# Patient Record
Sex: Female | Born: 1955 | Race: White | Hispanic: No | Marital: Married | Smoking: Former smoker
Health system: Southern US, Community
[De-identification: ages and names within clinical notes are randomized; demographics above are authoritative.]

## PROBLEM LIST (undated history)

## (undated) DIAGNOSIS — R7302 Impaired glucose tolerance (oral): Secondary | ICD-10-CM

## (undated) DIAGNOSIS — F32A Depression, unspecified: Secondary | ICD-10-CM

## (undated) DIAGNOSIS — K219 Gastro-esophageal reflux disease without esophagitis: Secondary | ICD-10-CM

## (undated) DIAGNOSIS — F329 Major depressive disorder, single episode, unspecified: Secondary | ICD-10-CM

## (undated) DIAGNOSIS — M1990 Unspecified osteoarthritis, unspecified site: Secondary | ICD-10-CM

## (undated) DIAGNOSIS — I639 Cerebral infarction, unspecified: Secondary | ICD-10-CM

## (undated) DIAGNOSIS — E785 Hyperlipidemia, unspecified: Secondary | ICD-10-CM

## (undated) DIAGNOSIS — G4730 Sleep apnea, unspecified: Secondary | ICD-10-CM

## (undated) DIAGNOSIS — I10 Essential (primary) hypertension: Secondary | ICD-10-CM

## (undated) DIAGNOSIS — R531 Weakness: Secondary | ICD-10-CM

## (undated) DIAGNOSIS — G5680 Other specified mononeuropathies of unspecified upper limb: Secondary | ICD-10-CM

## (undated) DIAGNOSIS — R519 Headache, unspecified: Secondary | ICD-10-CM

## (undated) DIAGNOSIS — K222 Esophageal obstruction: Secondary | ICD-10-CM

## (undated) DIAGNOSIS — R51 Headache: Secondary | ICD-10-CM

## (undated) HISTORY — DX: Weakness: R53.1

## (undated) HISTORY — DX: Cerebral infarction, unspecified: I63.9

## (undated) HISTORY — PX: ESOPHAGOGASTRODUODENOSCOPY: SHX1529

## (undated) HISTORY — DX: Hyperlipidemia, unspecified: E78.5

## (undated) HISTORY — DX: Essential (primary) hypertension: I10

## (undated) HISTORY — DX: Headache: R51

## (undated) HISTORY — DX: Esophageal obstruction: K22.2

## (undated) HISTORY — DX: Gastro-esophageal reflux disease without esophagitis: K21.9

## (undated) HISTORY — DX: Major depressive disorder, single episode, unspecified: F32.9

## (undated) HISTORY — DX: Other specified mononeuropathies of unspecified upper limb: G56.80

## (undated) HISTORY — DX: Impaired glucose tolerance (oral): R73.02

## (undated) HISTORY — DX: Depression, unspecified: F32.A

## (undated) HISTORY — DX: Headache, unspecified: R51.9

---

## 1977-01-10 HISTORY — PX: TUBAL LIGATION: SHX77

## 1977-01-10 HISTORY — PX: UMBILICAL HERNIA REPAIR: SHX196

## 1997-11-10 ENCOUNTER — Other Ambulatory Visit: Admission: RE | Admit: 1997-11-10 | Discharge: 1997-11-10 | Payer: Self-pay | Admitting: Family Medicine

## 1998-03-18 ENCOUNTER — Encounter: Payer: Self-pay | Admitting: Family Medicine

## 1998-03-18 ENCOUNTER — Ambulatory Visit (HOSPITAL_COMMUNITY): Admission: RE | Admit: 1998-03-18 | Discharge: 1998-03-18 | Payer: Self-pay | Admitting: Family Medicine

## 1998-08-17 ENCOUNTER — Emergency Department (HOSPITAL_COMMUNITY): Admission: EM | Admit: 1998-08-17 | Discharge: 1998-08-17 | Payer: Self-pay | Admitting: Emergency Medicine

## 1998-08-17 ENCOUNTER — Encounter: Payer: Self-pay | Admitting: Emergency Medicine

## 1998-08-18 ENCOUNTER — Ambulatory Visit (HOSPITAL_COMMUNITY): Admission: RE | Admit: 1998-08-18 | Discharge: 1998-08-18 | Payer: Self-pay | Admitting: General Surgery

## 1998-08-18 ENCOUNTER — Encounter: Payer: Self-pay | Admitting: General Surgery

## 1998-11-18 ENCOUNTER — Other Ambulatory Visit: Admission: RE | Admit: 1998-11-18 | Discharge: 1998-11-18 | Payer: Self-pay | Admitting: Family Medicine

## 1999-01-18 ENCOUNTER — Other Ambulatory Visit: Admission: RE | Admit: 1999-01-18 | Discharge: 1999-01-18 | Payer: Self-pay | Admitting: Obstetrics and Gynecology

## 1999-03-19 ENCOUNTER — Encounter: Payer: Self-pay | Admitting: Family Medicine

## 1999-03-19 ENCOUNTER — Encounter: Admission: RE | Admit: 1999-03-19 | Discharge: 1999-03-19 | Payer: Self-pay | Admitting: Family Medicine

## 1999-12-10 ENCOUNTER — Other Ambulatory Visit: Admission: RE | Admit: 1999-12-10 | Discharge: 1999-12-10 | Payer: Self-pay | Admitting: Obstetrics and Gynecology

## 2000-03-06 ENCOUNTER — Other Ambulatory Visit: Admission: RE | Admit: 2000-03-06 | Discharge: 2000-03-06 | Payer: Self-pay | Admitting: Obstetrics and Gynecology

## 2000-03-29 ENCOUNTER — Other Ambulatory Visit: Admission: RE | Admit: 2000-03-29 | Discharge: 2000-03-29 | Payer: Self-pay | Admitting: Obstetrics and Gynecology

## 2000-11-20 ENCOUNTER — Encounter: Payer: Self-pay | Admitting: Gastroenterology

## 2000-11-20 ENCOUNTER — Ambulatory Visit (HOSPITAL_COMMUNITY): Admission: RE | Admit: 2000-11-20 | Discharge: 2000-11-20 | Payer: Self-pay | Admitting: Gastroenterology

## 2001-12-19 ENCOUNTER — Encounter: Payer: Self-pay | Admitting: Emergency Medicine

## 2001-12-19 ENCOUNTER — Inpatient Hospital Stay (HOSPITAL_COMMUNITY): Admission: EM | Admit: 2001-12-19 | Discharge: 2001-12-24 | Payer: Self-pay | Admitting: Emergency Medicine

## 2001-12-21 ENCOUNTER — Encounter: Payer: Self-pay | Admitting: Cardiology

## 2003-03-24 ENCOUNTER — Inpatient Hospital Stay (HOSPITAL_COMMUNITY): Admission: EM | Admit: 2003-03-24 | Discharge: 2003-03-26 | Payer: Self-pay | Admitting: Emergency Medicine

## 2004-07-20 ENCOUNTER — Emergency Department (HOSPITAL_COMMUNITY): Admission: EM | Admit: 2004-07-20 | Discharge: 2004-07-20 | Payer: Self-pay | Admitting: Emergency Medicine

## 2004-07-26 ENCOUNTER — Emergency Department (HOSPITAL_COMMUNITY): Admission: EM | Admit: 2004-07-26 | Discharge: 2004-07-26 | Payer: Self-pay | Admitting: Emergency Medicine

## 2009-01-10 DIAGNOSIS — K222 Esophageal obstruction: Secondary | ICD-10-CM

## 2009-01-10 HISTORY — DX: Esophageal obstruction: K22.2

## 2010-03-09 ENCOUNTER — Encounter (INDEPENDENT_AMBULATORY_CARE_PROVIDER_SITE_OTHER): Payer: Self-pay | Admitting: *Deleted

## 2010-03-18 NOTE — Letter (Signed)
Summary: New Patient letter  [facility]  [address] [place], [place] [address]   Phone: [phone]  Fax: [phone]       03/09/2010 MRN: [id number]  Casey Chen [address] [place], [place]  [address]  Dear Ms. Casey Chen,  Welcome to the Gastroenterology Division at [facility]    You are scheduled to see Dr.  Vladimir Creeks on April 19, 2010 at 1:30pm on the 3rd floor at [facility], [address]  We ask that you try to arrive at our office 15 minutes prior to your appointment time to allow for check-in.  We would like you to complete the enclosed self-administered evaluation form prior to your visit and bring it with you on the day of your appointment.  We will review it with you.  Also, please bring a complete list of all your medications or, if you prefer, bring the medication bottles and we will list them.  Please bring your insurance card so that we may make a copy of it.  If your insurance requires a referral to see a specialist, please bring your referral form from your primary care physician.  Co-payments are due at the time of your visit and may be paid by cash, check or credit card.     Your office visit will consist of a consult with your physician (includes a physical exam), any laboratory testing he/she may order, scheduling of any necessary diagnostic testing (e.g. x-ray, ultrasound, CT-scan), and scheduling of a procedure (e.g. Endoscopy, Colonoscopy) if required.  Please allow enough time on your schedule to allow for any/all of these possibilities.    If you cannot keep your appointment, please call [phone] to cancel or reschedule prior to your appointment date.  This allows [place] the opportunity to schedule an appointment for another patient in need of care.  If you do not cancel or reschedule by 5 p.m. the business day prior to your appointment date, you will be charged a $50.00 late cancellation/no-show fee.    Thank you for  choosing [facility] for your medical needs.  We appreciate the opportunity to care for you.  Please visit [place] at our website  to learn more about our practice.                     Sincerely,                                                             The Gastroenterology Division

## 2010-04-19 ENCOUNTER — Encounter: Payer: Self-pay | Admitting: Gastroenterology

## 2010-04-19 ENCOUNTER — Ambulatory Visit (INDEPENDENT_AMBULATORY_CARE_PROVIDER_SITE_OTHER): Payer: BLUE CROSS/BLUE SHIELD | Admitting: Gastroenterology

## 2010-04-19 DIAGNOSIS — R1310 Dysphagia, unspecified: Secondary | ICD-10-CM | POA: Insufficient documentation

## 2010-04-19 DIAGNOSIS — Z1211 Encounter for screening for malignant neoplasm of colon: Secondary | ICD-10-CM

## 2010-04-19 DIAGNOSIS — K219 Gastro-esophageal reflux disease without esophagitis: Secondary | ICD-10-CM | POA: Insufficient documentation

## 2010-04-19 MED ORDER — PEG-KCL-NACL-NASULF-NA ASC-C 100 G PO SOLR: 1.0000 | Freq: Once | ORAL | Status: AC

## 2010-04-19 NOTE — Patient Instructions (Addendum)
Esophageal Stricture (Narrowing) The esophagus is the long, narrow tube which carries food and liquid from the mouth to the stomach. Sometimes a part of the esophagus becomes narrow and makes it difficult, painful, or even impossible to swallow. This is called an esophageal stricture.  SYMPTOMS Some of the problems are difficulty swallowing or pain with swallowing. CAUSES Common causes of blockage or strictures of the esophagus are:  Exposure of the lower esophagus to the acid from the stomach may cause narrowing.   Hiatal hernia in which a small part of the stomach bulges up through the diaphragm can cause a narrowing in the bottom of the esophagus.   Scleroderma is a tissue disorder that affects the esophagus and makes swallowing difficult.   Achalasia is an absence of nerves in the lower esophagus and to the esophageal sphincter. This absence of nerves may be congenital (present since birth). This can cause irregular spasms which do not allow food and fluid through.   Strictures may develop from swallowing materials which damage the esophagus. Examples are acids or alkalis such as lye.   Schatzki's Ring is a narrow ring of non-cancerous tissue which narrows the lower esophagus. The cause of this is unknown.   Growths can block the esophagus.  DIAGNOSIS Your caregiver often suspects this problem by taking a medical history. They will also do a physical exam. They may then take X-rays and/or perform an endoscopy. Endoscopy is an exam in which a tube like a small flexible telescope is used to look at your esophagus.  TREATMENT AND PROCEDURE  One form of treatment is to dilate the narrow area. This means to stretch it.   When this is not successful, chest surgery may be required. This is a much more extensive form of treatment with a longer recovery time.  Both of the above treatments make the passage of food and water into the stomach easier. They also make it easier for stomach contents  to bubble back into the esophagus. Special medications may be used following the procedure to help prevent further narrowing. Medications may be used to lower the amount of acid in the stomach juice.  SEEK IMMEDIATE MEDICAL CARE IF:  Your swallowing is becoming more painful, difficult, or you are unable to swallow.   You vomit up blood.   You develop black tarry stools.   You develop chills or an unexplained fever of over 101 F (38.3 C).   You develop chest or abdominal pain.   You develop shortness of breath, feel lightheaded, or faint.  Follow up with medical care as your caregiver suggests. Document Released: 09/06/2005 Document Re-Released: 10/24/2006 [facility] Patient Information 2011 [place], [place]. Colonoscopy A colonoscopy is an exam to evaluate your entire colon. In this exam, your colon is cleansed. A long fiberoptic tube is inserted through your rectum and into your colon. The fiberoptic scope (endoscope) is a long bundle of enclosed and very flexible fibers. These fibers transmit light to the area examined and send images from that area to your caregiver. Discomfort is usually minimal. You may be given a drug to help you sleep (sedative) during or prior to the procedure. This exam helps to detect lumps (tumors), polyps, inflammation, and areas of bleeding. Your caregiver may also take a small piece of tissue (biopsy) that will be examined under a microscope. BEFORE THE PROCEDURE  A clear liquid diet may be required for 2 days before the exam.   Liquid injections (enemas) or laxatives may be required.  A large amount of electrolyte solution may be given to you to drink over a short period of time. This solution is used to clean out your colon.   You should be present 1 prior to your procedure or as directed by your caregiver.   Check in at the admissions desk to fill out necessary forms if not preregistered. There will be consent forms to sign prior to the procedure. If  accompanied by friends or family, there is a waiting area for them while you are having your procedure.  LET YOUR CAREGIVER KNOW ABOUT:  Allergies to food or medicine.  Medicines taken, including vitamins, herbs, eyedrops, over-the-counter medicines, and creams.   Use of steroids (by mouth or creams).   Previous problems with anesthetics or numbing medicines.   History of bleeding problems or blood clots.  Previous surgery.   Other health problems, including diabetes and kidney problems.   Possibility of pregnancy, if this applies.   AFTER THE PROCEDURE  If you received a sedative and/or pain medicine, you will need to arrange for someone to drive you home.   Occasionally, there is a little blood passed with the first bowel movement. DO NOT be concerned.  HOME CARE INSTRUCTIONS  It is not unusual to pass moderate amounts of gas and experience mild abdominal cramping following the procedure. This is due to air being used to inflate your colon during the exam. Walking or a warm pack on your belly (abdomen) may help.   You may resume all normal meals and activities after sedatives and medicines have worn off.   Only take over-the-counter or prescription medicines for pain, discomfort, or fever as directed by your caregiver. DO NOT use aspirin or blood thinners if a biopsy was taken. Consult your caregiver for medicine usage if biopsies were taken.  FINDING OUT THE RESULTS OF YOUR TEST Not all test results are available during your visit. If your test results are not back during the visit, make an appointment with your caregiver to find out the results. Do not assume everything is normal if you have not heard from your caregiver or the medical facility. It is important for you to follow up on all of your test results. SEEK IMMEDIATE MEDICAL CARE IF:  You pass large blood clots or fill a toilet with blood following the procedure. This may also occur 10 to 14 days following the procedure.  This is more likely if a biopsy was taken.   You develop abdominal pain that keeps getting worse and cannot be relieved with medicine.  Document Released: 12/25/1999 Document Re-Released: 03/23/2009 [facility] Patient Information 2011 [place], [place]. Acid Reflux (GERD) Acid reflux is also called gastroesophageal reflux disease (GERD). Your stomach makes acid to help digest food. Acid reflux happens when acid from your stomach goes into the tube between your mouth and stomach (esophagus). Your stomach is protected from the acid, but this tube is not. When acid gets into the tube, it may cause a burning feeling in the chest (heartburn). Besides heartburn, other health problems can happen if the acid keeps going into the tube. Some causes of acid reflux include:  Being overweight.   Smoking.   Drinking alcohol.   Eating large meals.   Eating meals and then going to bed right away.   Eating certain foods.   Increased stomach acid production.  HOME CARE  Take all medicine as told by your doctor.   You may need to:   Lose weight.   Avoid  alcohol.   Quit smoking.   Do not eat big meals. It is better to eat smaller meals throughout the day.   Do not eat a meal and then nap or go to bed.   Sleep with your head higher than your stomach.   Avoid foods that bother you.   You may need more tests, or you may need to see a special doctor.  GET HELP RIGHT AWAY IF:  You have chest pain that is different than before.   You have pain that goes to your arms, jaw, or between your shoulder blades.   You throw up (vomit) blood, dark brown liquid, or your throw up looks like coffee grounds.   You have trouble swallowing.   You have trouble breathing or cannot stop coughing.   You feel dizzy or pass out.   Your skin is cool, wet, and pale.   Your medicine is not helping.  MAKE SURE YOU:   Understand these instructions.   Will watch your condition.   Will get help right away if  you are not doing well or get worse.  Document Released: 06/15/2007 Document Re-Released: 03/23/2009 [facility] Patient Information 2011 [place], [place]. Your EGD is scheduled on 04/20/2010 at 3pm Your Colonoscopy is scheduled on 05/13/2010 at 1:30pm We will send in your MoviPrep to your pharmacy today

## 2010-04-19 NOTE — Progress Notes (Signed)
History of Present Illness:  Casey Chen is a pleasant, 55 year old white female with a history of GERD and an esophageal stricture referred at the request of Dr. Patsy Lager for evaluation of reflux and dysphagia. She's been on various PPIs. Most recently, she was placed on zegerid at bedtime. Her main complaint is burning and regurgitation after retiring. She sleeps propped up and does not eat for several hours prior to retiring. She also complains of dysphagia to solids. She's on no gastric irritants including nonsteroidals.    Review of Systems: Pertinent positive and negative review of systems were noted in the above HPI section. All other review of systems were otherwise negative.    Current Medications, Allergies, Past Medical History, Past Surgical History, Family History and Social History were reviewed in [employer] [facility] electronic medical record  Vital signs were reviewed in today's medical record. Physical Exam: General: Well developed , well nourished, no acute distress Head: Normocephalic and atraumatic Eyes:  sclerae anicteric, EOMI Ears: Normal auditory acuity Mouth: No deformity or lesions Lungs: Clear throughout to auscultation Heart: Regular rate and rhythm; no murmurs, rubs or bruits Abdomen: Soft, non tender and non distended. No masses, hepatosplenomegaly or hernias noted. Normal Bowel sounds Rectal:deferred Musculoskeletal: Symmetrical with no gross deformities  Pulses:  Normal pulses noted Extremities: No clubbing, cyanosis, edema or deformities noted Neurological: Alert oriented x 4, grossly nonfocal Psychological:  Alert and cooperative. Normal mood and affect

## 2010-04-19 NOTE — Assessment & Plan Note (Signed)
This is probably to due to a recurrent esophageal stricture  Recommendations #1 upper endoscopy with dilatation as indicated

## 2010-04-19 NOTE — Assessment & Plan Note (Addendum)
She has nocturnal GERD despite antireflux measures and Zegerid.  Recommendations #1 increase Zegerid twice a day #2 if not improved I would proceed with a gastric emptying scan

## 2010-04-19 NOTE — Assessment & Plan Note (Signed)
Screening colonoscopy recommended

## 2010-04-20 ENCOUNTER — Ambulatory Visit (AMBULATORY_SURGERY_CENTER): Payer: BC Managed Care – PPO | Admitting: Gastroenterology

## 2010-04-20 ENCOUNTER — Encounter: Payer: Self-pay | Admitting: Gastroenterology

## 2010-04-20 DIAGNOSIS — R1310 Dysphagia, unspecified: Secondary | ICD-10-CM

## 2010-04-20 DIAGNOSIS — K222 Esophageal obstruction: Secondary | ICD-10-CM

## 2010-04-20 DIAGNOSIS — K219 Gastro-esophageal reflux disease without esophagitis: Secondary | ICD-10-CM

## 2010-04-20 MED ORDER — OMEPRAZOLE-SODIUM BICARBONATE 40-1100 MG PO CAPS: 1.0000 | ORAL_CAPSULE | ORAL | Status: DC

## 2010-04-20 MED ORDER — SODIUM CHLORIDE 0.9 % IV SOLN: 500.0000 mL | INTRAVENOUS | Status: DC

## 2010-04-20 NOTE — Patient Instructions (Addendum)
Acid Reflux (GERD) Acid reflux is also called gastroesophageal reflux disease (GERD). Your stomach makes acid to help digest food. Acid reflux happens when acid from your stomach goes into the tube between your mouth and stomach (esophagus). Your stomach is protected from the acid, but this tube is not. When acid gets into the tube, it may cause a burning feeling in the chest (heartburn). Besides heartburn, other health problems can happen if the acid keeps going into the tube. Some causes of acid reflux include:  Being overweight.   Smoking.   Drinking alcohol.   Eating large meals.   Eating meals and then going to bed right away.   Eating certain foods.   Increased stomach acid production.  HOME CARE  Take all medicine as told by your doctor.   You may need to:   Lose weight.   Avoid alcohol.   Quit smoking.   Do not eat big meals. It is better to eat smaller meals throughout the day.   Do not eat a meal and then nap or go to bed.   Sleep with your head higher than your stomach.   Avoid foods that bother you.   You may need more tests, or you may need to see a special doctor.  GET HELP RIGHT AWAY IF:  You have chest pain that is different than before.   You have pain that goes to your arms, jaw, or between your shoulder blades.   You throw up (vomit) blood, dark brown liquid, or your throw up looks like coffee grounds.   You have trouble swallowing.   You have trouble breathing or cannot stop coughing.   You feel dizzy or pass out.   Your skin is cool, wet, and pale.   Your medicine is not helping.  MAKE SURE YOU:   Understand these instructions.   Will watch your condition.   Will get help right away if you are not doing well or get worse.  Document Released: 06/15/2007 Document Re-Released: 03/23/2009 [facility] Patient Information 2011 [place], [place]  .Esophageal Stricture (Narrowing) The esophagus is the long, narrow tube which carries food and  liquid from the mouth to the stomach. Sometimes a part of the esophagus becomes narrow and makes it difficult, painful, or even impossible to swallow. This is called an esophageal stricture.  SYMPTOMS Some of the problems are difficulty swallowing or pain with swallowing. CAUSES Common causes of blockage or strictures of the esophagus are:  Exposure of the lower esophagus to the acid from the stomach may cause narrowing.   Hiatal hernia in which a small part of the stomach bulges up through the diaphragm can cause a narrowing in the bottom of the esophagus.   Scleroderma is a tissue disorder that affects the esophagus and makes swallowing difficult.   Achalasia is an absence of nerves in the lower esophagus and to the esophageal sphincter. This absence of nerves may be congenital (present since birth). This can cause irregular spasms which do not allow food and fluid through.   Strictures may develop from swallowing materials which damage the esophagus. Examples are acids or alkalis such as lye.   Schatzki's Ring is a narrow ring of non-cancerous tissue which narrows the lower esophagus. The cause of this is unknown.   Growths can block the esophagus.  DIAGNOSIS Your caregiver often suspects this problem by taking a medical history. They will also do a physical exam. They may then take X-rays and/or perform an endoscopy. Endoscopy is  an exam in which a tube like a small flexible telescope is used to look at your esophagus.  TREATMENT AND PROCEDURE  One form of treatment is to dilate the narrow area. This means to stretch it.   When this is not successful, chest surgery may be required. This is a much more extensive form of treatment with a longer recovery time.  Both of the above treatments make the passage of food and water into the stomach easier. They also make it easier for stomach contents to bubble back into the esophagus. Special medications may be used following the procedure to  help prevent further narrowing. Medications may be used to lower the amount of acid in the stomach juice.  SEEK IMMEDIATE MEDICAL CARE IF:  Your swallowing is becoming more painful, difficult, or you are unable to swallow.   You vomit up blood.   You develop black tarry stools.   You develop chills or an unexplained fever of over 101 F (38.3 C).   You develop chest or abdominal pain.   You develop shortness of breath, feel lightheaded, or faint.  Follow up with medical care as your caregiver suggests. Document Released: 09/06/2005 Document Re-Released: 10/24/2006 [facility] Patient Information 2011 [place], [place]. CONTINUE YOUR REFLUX MEDICINE TWICE A DAY CALL DR Nita Sells OFFICE IN THE NEXT 2-3 DAYS TO SCHEDULE AN APPOINTMENT WITH HIM FOR 1 MONTH

## 2010-04-21 ENCOUNTER — Encounter: Payer: Self-pay | Admitting: Gastroenterology

## 2010-04-21 ENCOUNTER — Telehealth: Payer: Self-pay | Admitting: *Deleted

## 2010-04-21 NOTE — Telephone Encounter (Signed)
Follow up Call- Patient questions:  Do you have a fever, pain , or abdominal swelling? no Pain Score  0 *  Have you tolerated food without any problems? yes  Have you been able to return to your normal activities? yes  Do you have any questions about your discharge instructions: Diet   no Medications  no Follow up visit  no  Do you have questions or concerns about your Care? yes  Actions: * If pain score is 4 or above: No action needed, pain <4.  Reviewed summary of visit with patient and encouraged her to get report from her husband who was her care partner yesterday.

## 2010-05-12 ENCOUNTER — Encounter: Payer: Self-pay | Admitting: Gastroenterology

## 2010-05-13 ENCOUNTER — Encounter: Payer: Self-pay | Admitting: Gastroenterology

## 2010-05-13 ENCOUNTER — Ambulatory Visit (AMBULATORY_SURGERY_CENTER): Payer: BC Managed Care – PPO | Admitting: Gastroenterology

## 2010-05-13 VITALS — Temp 98.7°F | Ht 68.0 in | Wt 196.0 lb

## 2010-05-13 DIAGNOSIS — K621 Rectal polyp: Secondary | ICD-10-CM

## 2010-05-13 DIAGNOSIS — S98139A Complete traumatic amputation of one unspecified lesser toe, initial encounter: Secondary | ICD-10-CM

## 2010-05-13 DIAGNOSIS — Z1211 Encounter for screening for malignant neoplasm of colon: Secondary | ICD-10-CM

## 2010-05-13 DIAGNOSIS — K5730 Diverticulosis of large intestine without perforation or abscess without bleeding: Secondary | ICD-10-CM

## 2010-05-13 DIAGNOSIS — D126 Benign neoplasm of colon, unspecified: Secondary | ICD-10-CM

## 2010-05-13 MED ORDER — SODIUM CHLORIDE 0.9 % IV SOLN: 500.0000 mL | INTRAVENOUS | Status: DC

## 2010-05-13 NOTE — Patient Instructions (Addendum)
Polyps, Colon  A polyp is extra tissue that grows inside your body. Colon polyps grow in the large intestine. The large intestine, also called the colon, is part of your digestive system. It is a long, hollow tube at the end of your digestive tract where your body makes and stores stool. Most polyps are not dangerous. They are benign. This means they are not cancerous. But over time, some types of polyps can turn into cancer. Polyps that are smaller than a pea are usually not harmful. But larger polyps could someday become or may already be cancerous. To be safe, doctors remove all polyps and test them.  WHO GETS POLYPS? Anyone can get polyps, but certain people are more likely than others. You may have a greater chance of getting polyps if:  You are over 50.   You have had polyps before.   Someone in your family has had polyps.   Someone in your family has had cancer of the large intestine.   Find out if someone in your family has had polyps. You may also be more likely to get polyps if you:   Eat a lot of fatty foods   Smoke   Drink alcohol   Do not exercise  Eat too much  SYMPTOMS Most small polyps do not cause symptoms. People often do not know they have one until their caregiver finds it during a regular checkup or while testing them for something else. Some people do have symptoms like these:  Bleeding from the anus. You might notice blood on your underwear or on toilet paper after you have had a bowel movement.   Constipation or diarrhea that lasts more than a week.   Blood in the stool. Blood can make stool look black or it can show up as red streaks in the stool.  If you have any of these symptoms, see your caregiver. HOW DOES THE DOCTOR TEST FOR POLYPS? The doctor can use four tests to check for polyps:  Digital rectal exam. The caregiver wears gloves and checks your rectum (the last part of the large intestine) to see if it feels normal. This test would find polyps only  in the rectum. Your caregiver may need to do one of the other tests listed below to find polyps higher up in the intestine.   Barium enema. The caregiver puts a liquid called barium into your rectum before taking x-rays of your large intestine. Barium makes your intestine look white in the pictures. Polyps are dark, so they are easy to see.   Sigmoidoscopy. With this test, the caregiver can see inside your large intestine. A thin flexible tube is placed into your rectum. The device is called a sigmoidoscope, which has a light and a tiny video camera in it. The caregiver uses the sigmoidoscope to look at the last third of your large intestine.   Colonoscopy. This test is like sigmoidoscopy, but the caregiver looks at all of the large intestine. It usually requires sedation. This is the most common method for finding and removing polyps.  TREATMENT  The caregiver will remove the polyp during sigmoidoscopy or colonoscopy. The polyp is then tested for cancer.   If you have had polyps, your caregiver may want you to get tested regularly in the future.  PREVENTION There is not one sure way to prevent polyps. You might be able to lower your risk of getting them if you:  Eat more fruits and vegetables and less fatty food.     Do not smoke.   Avoid alcohol.   Exercise every day.   Lose weight if you are overweight.   Eating more calcium and folate can also lower your risk of getting polyps. Some foods that are rich in calcium are milk, cheese, and broccoli. Some foods that are rich in folate are chickpeas, kidney beans, and spinach.   Aspirin might help prevent polyps. Studies are under way.  Document Released: 09/23/2003 Document Re-Released: 06/16/2009 [facility] Patient Information 2011 [place], [place].                                  PT. & CAREPARTNER GIVEN INSTRUCTIONS ON SAFETY PRECAUTIONS DUE TO SEDATION RECEIVED TODAY.GREEN [employer] BLUE DISCHG. INSTRUCTION SHEETS REVEIWED WITH CAREPARTNER.

## 2010-05-14 ENCOUNTER — Telehealth: Payer: Self-pay | Admitting: *Deleted

## 2010-05-14 NOTE — Telephone Encounter (Signed)
Left message to call if needed. 

## 2010-06-08 ENCOUNTER — Ambulatory Visit (INDEPENDENT_AMBULATORY_CARE_PROVIDER_SITE_OTHER): Payer: BC Managed Care – PPO | Admitting: Gastroenterology

## 2010-06-08 ENCOUNTER — Encounter: Payer: Self-pay | Admitting: Gastroenterology

## 2010-06-08 DIAGNOSIS — D126 Benign neoplasm of colon, unspecified: Secondary | ICD-10-CM | POA: Insufficient documentation

## 2010-06-08 DIAGNOSIS — R1310 Dysphagia, unspecified: Secondary | ICD-10-CM

## 2010-06-08 DIAGNOSIS — K219 Gastro-esophageal reflux disease without esophagitis: Secondary | ICD-10-CM

## 2010-06-08 MED ORDER — OMEPRAZOLE-SODIUM BICARBONATE 40-1100 MG PO CAPS: 1.0000 | ORAL_CAPSULE | Freq: Every day | ORAL | Status: DC

## 2010-06-08 NOTE — Patient Instructions (Signed)
Follow up as needed

## 2010-06-08 NOTE — Progress Notes (Signed)
History of Present Illness:  [name] has returned following upper and lower endoscopy. The former was performed because of persistent reflux symptoms, especially at night. An early esophageal stricture was seen and dilated. On a regimen of Zegerid twice a day including each bedtime her regurgitation and pyrosis have almost entirely subsided. She has rare episodes of regurgitation.   Screening colonoscopy showed adenomatous polyps that were removed.    Review of Systems: Pertinent positive and negative review of systems were noted in the above HPI section. All other review of systems were otherwise negative.    Current Medications, Allergies, Past Medical History, Past Surgical History, Family History and Social History were reviewed in [employer] electronic medical record  Vital signs were reviewed in today's medical record. Physical Exam: General: Well developed , well nourished, no acute distress

## 2010-06-08 NOTE — Assessment & Plan Note (Signed)
Plan colonoscopy in 2015 because of colonic polyposis.

## 2010-06-08 NOTE — Assessment & Plan Note (Signed)
She is improved on a regimen of twice a day Zegerid. She was instructed to continue Zegerid each bedtime only.

## 2010-06-08 NOTE — Assessment & Plan Note (Addendum)
Improved following esophageal dilatation 

## 2010-12-17 ENCOUNTER — Encounter (INDEPENDENT_AMBULATORY_CARE_PROVIDER_SITE_OTHER): Payer: BC Managed Care – PPO | Admitting: Family Medicine

## 2010-12-17 DIAGNOSIS — I10 Essential (primary) hypertension: Secondary | ICD-10-CM

## 2010-12-17 DIAGNOSIS — M25559 Pain in unspecified hip: Secondary | ICD-10-CM

## 2010-12-17 DIAGNOSIS — F339 Major depressive disorder, recurrent, unspecified: Secondary | ICD-10-CM

## 2010-12-17 DIAGNOSIS — Z01419 Encounter for gynecological examination (general) (routine) without abnormal findings: Secondary | ICD-10-CM

## 2010-12-17 DIAGNOSIS — E669 Obesity, unspecified: Secondary | ICD-10-CM

## 2010-12-17 DIAGNOSIS — Z0000 Encounter for general adult medical examination without abnormal findings: Secondary | ICD-10-CM

## 2010-12-17 DIAGNOSIS — M545 Low back pain: Secondary | ICD-10-CM

## 2011-01-11 DIAGNOSIS — I639 Cerebral infarction, unspecified: Secondary | ICD-10-CM

## 2011-01-11 HISTORY — DX: Cerebral infarction, unspecified: I63.9

## 2011-01-20 ENCOUNTER — Other Ambulatory Visit: Payer: Self-pay | Admitting: Gastroenterology

## 2011-02-11 ENCOUNTER — Ambulatory Visit (INDEPENDENT_AMBULATORY_CARE_PROVIDER_SITE_OTHER): Payer: BC Managed Care – PPO | Admitting: Family Medicine

## 2011-02-11 ENCOUNTER — Encounter: Payer: Self-pay | Admitting: Family Medicine

## 2011-02-11 DIAGNOSIS — R05 Cough: Secondary | ICD-10-CM

## 2011-02-11 DIAGNOSIS — R059 Cough, unspecified: Secondary | ICD-10-CM

## 2011-02-11 DIAGNOSIS — J019 Acute sinusitis, unspecified: Secondary | ICD-10-CM

## 2011-02-11 DIAGNOSIS — J0110 Acute frontal sinusitis, unspecified: Secondary | ICD-10-CM

## 2011-02-11 DIAGNOSIS — J029 Acute pharyngitis, unspecified: Secondary | ICD-10-CM

## 2011-02-11 MED ORDER — AMOXICILLIN 500 MG PO CAPS: 500.0000 mg | ORAL_CAPSULE | Freq: Three times a day (TID) | ORAL | Status: AC

## 2011-02-11 NOTE — Patient Instructions (Signed)

## 2011-02-11 NOTE — Progress Notes (Signed)
  Subjective:    Patient ID: Casey Chen, female    DOB: 01-Mar-1955, 56 y.o.   MRN: [id number]  Sore Throat  This is a new problem. The current episode started yesterday. The problem has been gradually worsening. There has been no fever. The pain is mild. Associated symptoms include congestion, coughing and ear pain. Pertinent negatives include no abdominal pain, ear discharge, hoarse voice or shortness of breath. She has had no exposure to strep. She has tried cool liquids for the symptoms. The treatment provided mild relief.  Mucinex without benefit    Review of Systems  Constitutional: Positive for fatigue. Negative for fever and chills.  HENT: Positive for ear pain, congestion, rhinorrhea and postnasal drip. Negative for hoarse voice, neck stiffness, tinnitus and ear discharge.   Eyes: Negative for pain, discharge, itching and visual disturbance.  Respiratory: Positive for cough. Negative for shortness of breath and wheezing.   Cardiovascular: Negative for chest pain, palpitations and leg swelling.  Gastrointestinal: Negative.  Negative for abdominal pain.  Genitourinary: Negative for dysuria and hematuria.  Musculoskeletal: Negative.   Neurological: Negative for dizziness.  Hematological: Negative.        Objective:   Physical Exam  Constitutional: She is oriented to person, place, and time. She appears well-developed and well-nourished.  HENT:  Head: Normocephalic and atraumatic.  Right Ear: External ear normal.  Left Ear: External ear normal.  Mouth/Throat: Oropharynx is clear and moist. No oropharyngeal exudate.  Neck: Normal range of motion. Neck supple. No tracheal deviation present. No thyromegaly present.  Cardiovascular: Normal rate and regular rhythm.  Exam reveals no gallop and no friction rub.   No murmur heard. Pulmonary/Chest: Effort normal and breath sounds normal. No stridor.  Abdominal: Soft. Bowel sounds are normal.  Lymphadenopathy:    She has cervical  adenopathy.  Neurological: She is alert and oriented to person, place, and time.  Skin: Skin is warm and dry.          Assessment & Plan:  1. Sinusitis with cough and pharyngitis-Amoxacillin 500 mg TID x 10 Days. Symptomatic treatment given. Patient declined rx cough meds.

## 2011-04-15 ENCOUNTER — Other Ambulatory Visit: Payer: Self-pay | Admitting: Gastroenterology

## 2011-04-15 NOTE — Telephone Encounter (Signed)
Pt will need to follow up with provider for future refills

## 2011-04-22 ENCOUNTER — Telehealth: Payer: Self-pay

## 2011-04-22 MED ORDER — VERAPAMIL HCL ER 240 MG PO TBCR: 240.0000 mg | EXTENDED_RELEASE_TABLET | Freq: Every day | ORAL | Status: DC

## 2011-04-22 NOTE — Telephone Encounter (Signed)
Candace from [facility] in [place] states that they have requested a refill for verapamil 3 times and now pt is currently out of her medication. [facility] [phone]

## 2011-05-15 ENCOUNTER — Other Ambulatory Visit: Payer: Self-pay | Admitting: Family Medicine

## 2011-06-18 ENCOUNTER — Other Ambulatory Visit: Payer: Self-pay | Admitting: Physician Assistant

## 2011-06-27 ENCOUNTER — Other Ambulatory Visit: Payer: Self-pay | Admitting: *Deleted

## 2011-08-13 ENCOUNTER — Other Ambulatory Visit: Payer: Self-pay | Admitting: Physician Assistant

## 2011-09-28 ENCOUNTER — Other Ambulatory Visit: Payer: Self-pay | Admitting: Physician Assistant

## 2012-02-14 LAB — HM PAP SMEAR: HM Pap smear: NEGATIVE

## 2012-07-21 ENCOUNTER — Other Ambulatory Visit: Payer: Self-pay | Admitting: Gastroenterology

## 2012-09-17 ENCOUNTER — Ambulatory Visit (INDEPENDENT_AMBULATORY_CARE_PROVIDER_SITE_OTHER): Payer: BC Managed Care – PPO | Admitting: Neurology

## 2012-09-17 ENCOUNTER — Encounter: Payer: Self-pay | Admitting: Neurology

## 2012-09-17 VITALS — BP 126/76 | HR 87 | Temp 99.2°F | Ht 67.0 in | Wt 199.0 lb

## 2012-09-17 DIAGNOSIS — Z8673 Personal history of transient ischemic attack (TIA), and cerebral infarction without residual deficits: Secondary | ICD-10-CM

## 2012-09-17 DIAGNOSIS — G4733 Obstructive sleep apnea (adult) (pediatric): Secondary | ICD-10-CM

## 2012-09-17 DIAGNOSIS — E785 Hyperlipidemia, unspecified: Secondary | ICD-10-CM

## 2012-09-17 DIAGNOSIS — I10 Essential (primary) hypertension: Secondary | ICD-10-CM

## 2012-09-17 DIAGNOSIS — R930 Abnormal findings on diagnostic imaging of skull and head, not elsewhere classified: Secondary | ICD-10-CM

## 2012-09-17 DIAGNOSIS — R200 Anesthesia of skin: Secondary | ICD-10-CM

## 2012-09-17 DIAGNOSIS — R002 Palpitations: Secondary | ICD-10-CM

## 2012-09-17 DIAGNOSIS — R9089 Other abnormal findings on diagnostic imaging of central nervous system: Secondary | ICD-10-CM

## 2012-09-17 DIAGNOSIS — R209 Unspecified disturbances of skin sensation: Secondary | ICD-10-CM

## 2012-09-17 NOTE — Progress Notes (Signed)
Subjective:    Patient ID: Casey Chen is a 57 y.o. female.  HPI  Huston Foley, MD, PhD [facility] [address], [address] [address] [place], [place] [address]  Dear Dr. Link Snuffer,   I saw your patient, Casey Chen, upon your kind request in my neurologic clinic today for initial consultation of her pain and numbness. The patient is unaccompanied today. Thank you also for talking with me on the phone on 09/14/2012 about her case. As you know, Casey Chen is a very pleasant 57 year old right-handed woman with an underlying medical history of hyperlipidemia, obesity, OSA, arthritis, reflux disease, depression, and hypertension, who had an episode of transient facial numbness which started about 2 weeks ago. This was in the left mid face area and around the L temple. She was also experiencing a frontal headache and the maxillary sinus region. She had a brain MRI without contrast on 09/06/2012 which showed moderate nonspecific hemispheric white matter changes, appearing advanced for age. Diffusion imaging was negative for acute infarct. 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 Her cardiovascular risk factors include stroke in 2005 with LUE weakness, smoking - she quit some 15-20 years ago and HTN and HLP and she also has OSA and uses a CPAP.   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 She had an MRA head on 09/07/2012 which showed developmental variants of the posterior circulation, otherwise negative study without significant stenosis, aneurysm or vascular malformation. She has more recently been experiencing a pain in the mid back on the right side. She has had no other neurological Sx. She has a Hx of loss of vision in 2007 or 2008, which affected both eyes and lasted for seconds only. She did not seek medical attention at the  time and has no residual weakness or numbness from her prior stroke. She has had palpitations for years. She reports that suddenly her heart starts pounding irregularly and she feels very fatigued or even week when this happens. This can happen once a month or once every several weeks or once every few months. She has not had a cardiac monitor. She currently denies any headache or one-sided weakness or numbness other than numbness in her left face as described above. She denies any facial weakness or vision loss. She denies any hearing loss. She had been on a baby aspirin since her stroke in 2005 but increased this recently to a adult size aspirin daily since her new facial numbness.  Her Past Medical History Is Significant For: Past Medical History  Diagnosis Date  . GERD (gastroesophageal reflux disease)   . Head ache   . Depression   . Esophageal stricture   . Hyperlipidemia   . Hypertension   . Stroke     TIA    Her Past Surgical History Is Significant For: Past Surgical History  Procedure Laterality Date  . Umbilical hernia repair    . Esophagogastroduodenoscopy    . Tubal ligation      Her Family History Is Significant For: Family History  Problem Relation Age of Onset  . Colon polyps Mother     father  . Irritable bowel syndrome Mother   . Lung cancer Mother   . Brain cancer Brother   . Diabetes Sister   . Heart disease Father   . Colon cancer Neg Hx     Her  Social History Is Significant For: History   Social History  . Marital Status: Married    Spouse Name: N/A    Number of Children: 2  . Years of Education: N/A   Occupational History  . bookkeeper    Social History Main Topics  . Smoking status: Former Smoker -- 1.00 packs/day    Types: Cigarettes    Quit date: 01/10/1990  . Smokeless tobacco: Never Used  . Alcohol Use: 0.5 oz/week    1 drink(s) per week  . Drug Use: No  . Sexual Activity: None   Other Topics Concern  . None   Social History  Narrative  . None    Her Allergies Are:  Allergies  Allergen Reactions  . Tetanus Toxoids Swelling  :   Her Current Medications Are:  Outpatient Encounter Prescriptions as of 09/17/2012  Medication Sig Dispense Refill  . aspirin 81 MG tablet Take 81 mg by mouth daily.        Marland Kitchen FLUoxetine (PROZAC) 20 MG capsule TAKE ONE CAPSULE BY MOUTH EVERY DAY  30 capsule  1  . LIVALO 4 MG TABS 1 tablet by Per post-pyloric tube route at bedtime.      Marland Kitchen losartan-hydrochlorothiazide (HYZAAR) 100-25 MG per tablet Take 1 tablet by mouth daily.        Marland Kitchen omeprazole-sodium bicarbonate (ZEGERID) 40-1100 MG per capsule TAKE 1 CAPSULE 2 TIMES DAILY BEFORE BREAKFAST AND AT BEDTIME  60 capsule  4  . ondansetron (ZOFRAN-ODT) 8 MG disintegrating tablet       . predniSONE (DELTASONE) 20 MG tablet Take 2 tablets by mouth every morning.      . traMADol (ULTRAM) 50 MG tablet Take 1 tablet by mouth as needed.      . verapamil (CALAN-SR) 240 MG CR tablet Take 240 mg by mouth at bedtime.        . verapamil (CALAN-SR) 240 MG CR tablet TAKE 1 TABLET (240 MG TOTAL) BY MOUTH AT BEDTIME. NEEDS OFFICE VISIT FOR MORE  15 tablet  0  . polycarbophil (FIBERCON) 625 MG tablet Take 625 mg by mouth daily.        . [DISCONTINUED] omeprazole-sodium bicarbonate (ZEGERID) 40-1100 MG per capsule Take 1 capsule by mouth at bedtime.  30 capsule  6  . [DISCONTINUED] omeprazole-sodium bicarbonate (ZEGERID) 40-1100 MG per capsule TAKE 1 CAPSULE 2 TIMES DAILY AS DIRECTED BEFORE BREAKFAST AND AT BEDTIME  60 capsule  2   Facility-Administered Encounter Medications as of 09/17/2012  Medication Dose Route Frequency Provider Last Rate Last Dose  . 0.9 %  sodium chloride infusion  500 mL Intravenous Continuous Louis Meckel, MD      . 0.9 %  sodium chloride infusion  500 mL Intravenous Continuous Louis Meckel, MD      :  Review of Systems  Gastrointestinal: Positive for constipation.  Neurological: Positive for dizziness and numbness.     Objective:  Neurologic Exam  Physical Exam Physical Examination:   Filed Vitals:   09/17/12 1132  BP: 126/76  Pulse: 87  Temp: 99.2 F (37.3 C)    General Examination: The patient is a very pleasant 57 y.o. female in no acute distress. She appears well-developed and well-nourished and well groomed. She is mildly anxious appearing.  HEENT: Normocephalic, atraumatic, pupils are equal, round and reactive to light and accommodation. Funduscopic exam is normal with sharp disc margins noted. Extraocular tracking is good without limitation to gaze excursion or nystagmus noted. Normal smooth pursuit is noted.  Hearing is grossly intact. Tympanic membranes are clear bilaterally. Face is symmetric with normal facial animation and normal facial sensation with the exception of a small area around the L temple and upper lateral L face of decrease sensation for PP, temperature and LT. Speech is clear with no dysarthria noted. There is no hypophonia. There is no lip, neck/head, jaw or voice tremor. Neck is supple with full range of passive and active motion. There are no carotid bruits on auscultation. Oropharynx exam reveals: mild mouth dryness, good dental hygiene and mild airway crowding. Mallampati is class II. Tongue protrudes centrally and palate elevates symmetrically.   Chest: Clear to auscultation without wheezing, rhonchi or crackles noted.  Heart: S1+S2+0, regular and normal without murmurs, rubs or gallops noted.   Abdomen: Soft, non-tender and non-distended with normal bowel sounds appreciated on auscultation.  Extremities: There is no pitting edema in the distal lower extremities bilaterally. Pedal pulses are intact.  Skin: Warm and dry without trophic changes noted. There are no varicose veins.  Musculoskeletal: exam reveals no obvious joint deformities, tenderness or joint swelling or erythema.   Neurologically:  Mental status: The patient is awake, alert and oriented in all 4  spheres. Her memory, attention, language and knowledge are appropriate. There is no aphasia, agnosia, apraxia or anomia. Speech is clear with normal prosody and enunciation. Thought process is linear. Mood is congruent and affect is normal.  Cranial nerves are as described above under HEENT exam. In addition, shoulder shrug is normal with equal shoulder height noted. Motor exam: Normal bulk, strength and tone is noted. There is no drift, tremor or rebound. Romberg is negative. Reflexes are 2+ throughout. Toes are downgoing bilaterally. Fine motor skills are intact with normal finger taps, normal hand movements, normal rapid alternating patting, normal foot taps and normal foot agility.  Cerebellar testing shows no dysmetria or intention tremor on finger to nose testing. Heel to shin is unremarkable bilaterally. There is no truncal or gait ataxia.  Sensory exam is intact to light touch, pinprick, vibration, temperature sense and proprioception in the upper and lower extremities.  Gait, station and balance are unremarkable. No veering to one side is noted. No leaning to one side is noted. Posture is age-appropriate and stance is narrow based. No problems turning are noted. She turns en bloc. Tandem walk is unremarkable. Intact toe and heel stance is noted.               Assessment and Plan:  Assessment and Plan:  In summary, ANJULIE DIPIERRO is a very pleasant 57 y.o.-year old female with a history of new onset left facial numbness in the absence of any other neurological complaints at this time. When she first started noticing the facial numbness she had a headache. She has had thus far a brain MRI without contrast, a brain MRA and neck MRA which did not show any significant or acute findings. Brain MRI showed extensive white matter changes and I reviewed the scan. I did not appreciate any findings consistent with Dawson's fingers. She does have quite a number of vascular risk factors including a prior stroke,  OSA, hyperlipidemia, hypertension and she also reports a history of binocular very brief vision loss in the distant past. Her history and physical exam under particularly suggestive of MS and I think if anything her vascular disease risk factors out way the concern for a demyelinating disease. Nevertheless I do think we need to do further testing and we may proceed down  the Road with spinal fluid testing as well. At this juncture, however, I would like to repeat her brain MRI with contrast and had a echocardiogram and cardiac monitor because she also reports intermittent palpitations. Her physical exam is non-focal with the exception of a small area of facial or left temporal numbness. I would also like to do additional blood work including rheumatological and autoimmune markers. I would like for her to continue her current medications which includes the daily adult size aspirin. We will be calling her with her test results as we get them back. Again, we may proceed with cerebrospinal fluid testing down the road as well I would like to gather additional data first. I would like to see her back in 3 months from now, sooner if the need arises and depending on the test results. She was in agreement and I encouraged her to call with any interim questions, concerns, problems or updates and test results.   Thank you very much for allowing me to participate in the care of this nice patient. If I can be of any further assistance to you please do not hesitate to call me at [phone].  Sincerely,   Huston Foley, MD, PhD

## 2012-09-17 NOTE — Patient Instructions (Signed)
I think overall you are doing fairly well but I do want to suggest a few things today:  Remember to drink plenty of fluid, eat healthy meals and do not skip any meals. Try to eat protein with a every meal and eat a healthy snack such as fruit or nuts in between meals. Try to keep a regular sleep-wake schedule and try to exercise daily, particularly in the form of walking, 20-30 minutes a day, if you can.   Engage in social activities in your community and with your family and try to keep up with current events by reading the newspaper or watching the news.   As far as your medications are concerned, I would like to suggest    As far as diagnostic testing:   I would like to see you back in 3 months, sooner if we need to. Please call us with any interim questions, concerns, problems, updates or refill requests.  Please also call us for any test results so we can go over those with you on the phone. Brett Canales is my clinical assistant and will answer any of your questions and relay your messages to me and also relay most of my messages to you.  Our phone number is [phone]. We also have an after hours call service for urgent matters and there is a physician on-call for urgent questions. For any emergencies you know to call 911 or go to the nearest emergency room.

## 2012-09-18 LAB — C-REACTIVE PROTEIN: CRP: 1.9 mg/L (ref 0.0–4.9)

## 2012-09-18 LAB — COMPREHENSIVE METABOLIC PANEL
Albumin/Globulin Ratio: 1.9 (ref 1.1–2.5)
Albumin: 4.7 g/dL (ref 3.5–5.5)
BUN: 21 mg/dL (ref 6–24)
GFR calc Af Amer: 68 mL/min/{1.73_m2} (ref >59)
GFR calc non Af Amer: 59 mL/min/{1.73_m2} — ABNORMAL LOW (ref >59)
Glucose: 162 mg/dL — ABNORMAL HIGH (ref 65–99)
Total Bilirubin: 0.3 mg/dL (ref 0.0–1.2)
Total Protein: 7.2 g/dL (ref 6.0–8.5)

## 2012-09-18 LAB — RHEUMATOID FACTOR: Rhuematoid fact SerPl-aCnc: 9.4 IU/mL (ref 0.0–13.9)

## 2012-09-18 LAB — B12 AND FOLATE PANEL
Folate: 12.2 ng/mL (ref >3.0)
Vitamin B-12: 397 pg/mL (ref 211–946)

## 2012-09-18 LAB — HGB A1C W/O EAG: Hgb A1c MFr Bld: 6.3 % — ABNORMAL HIGH (ref 4.8–5.6)

## 2012-09-18 NOTE — Progress Notes (Signed)
Quick Note:  Please advise patient that her labs came back normal with respect to autoimmune and inflammatory markers. However, her blood sugar level was elevated at 162 and her diabetes marker called hemoglobin A1c was also slightly elevated at 6.3. This means that she is even a borderline diabetic or at risk to develop diabetes. Please ask her to discuss these findings with her primary care physician. Otherwise, labs were normal. Huston Foley, MD, PhD [facility] ([facility]  ______

## 2012-09-20 DIAGNOSIS — R200 Anesthesia of skin: Secondary | ICD-10-CM | POA: Insufficient documentation

## 2012-09-20 NOTE — Progress Notes (Signed)
Quick Note:  I called pt and relayed the lab results and message. She verbalized understanding. ______

## 2012-09-26 ENCOUNTER — Ambulatory Visit (INDEPENDENT_AMBULATORY_CARE_PROVIDER_SITE_OTHER): Payer: BC Managed Care – PPO

## 2012-09-26 DIAGNOSIS — I10 Essential (primary) hypertension: Secondary | ICD-10-CM

## 2012-09-26 DIAGNOSIS — R200 Anesthesia of skin: Secondary | ICD-10-CM

## 2012-09-26 DIAGNOSIS — R930 Abnormal findings on diagnostic imaging of skull and head, not elsewhere classified: Secondary | ICD-10-CM

## 2012-09-26 DIAGNOSIS — E785 Hyperlipidemia, unspecified: Secondary | ICD-10-CM

## 2012-09-26 DIAGNOSIS — Z8673 Personal history of transient ischemic attack (TIA), and cerebral infarction without residual deficits: Secondary | ICD-10-CM

## 2012-09-26 DIAGNOSIS — R209 Unspecified disturbances of skin sensation: Secondary | ICD-10-CM

## 2012-09-26 DIAGNOSIS — R9089 Other abnormal findings on diagnostic imaging of central nervous system: Secondary | ICD-10-CM

## 2012-09-26 MED ORDER — GADOPENTETATE DIMEGLUMINE 469.01 MG/ML IV SOLN: 19.0000 mL | Freq: Once | INTRAVENOUS | Status: AC | PRN

## 2012-10-03 ENCOUNTER — Telehealth: Payer: Self-pay | Admitting: Neurology

## 2012-10-04 ENCOUNTER — Telehealth: Payer: Self-pay

## 2012-10-04 NOTE — Telephone Encounter (Signed)
I called patient and reviewed MRI findings. She had some question regarding the chronic findings. I believe she understood. I let her know to call back with any questions. We scheduled patient for follow up appointment for December 5 at noon.

## 2012-11-02 ENCOUNTER — Ambulatory Visit (INDEPENDENT_AMBULATORY_CARE_PROVIDER_SITE_OTHER): Payer: BC Managed Care – PPO | Admitting: Internal Medicine

## 2012-11-02 VITALS — BP 140/70 | HR 93 | Temp 99.7°F | Resp 16 | Ht 67.5 in | Wt 202.0 lb

## 2012-11-02 DIAGNOSIS — J029 Acute pharyngitis, unspecified: Secondary | ICD-10-CM

## 2012-11-02 DIAGNOSIS — I10 Essential (primary) hypertension: Secondary | ICD-10-CM | POA: Insufficient documentation

## 2012-11-02 DIAGNOSIS — J329 Chronic sinusitis, unspecified: Secondary | ICD-10-CM

## 2012-11-02 MED ORDER — AMOXICILLIN 875 MG PO TABS: 875.0000 mg | ORAL_TABLET | Freq: Two times a day (BID) | ORAL | Status: DC

## 2012-11-02 MED ORDER — HYDROCODONE-HOMATROPINE 5-1.5 MG/5ML PO SYRP: 5.0000 mL | ORAL_SOLUTION | Freq: Four times a day (QID) | ORAL | Status: DC | PRN

## 2012-11-02 NOTE — Patient Instructions (Signed)

## 2012-11-02 NOTE — Progress Notes (Signed)
Subjective:    Patient ID: Casey Chen, female    DOB: 01/24/1955, 57 y.o.   MRN: [id number]  HPI Five days of sore throat, has progressed throughout the week.Muscle aches. Chest heavy, cough at night, nonproductive.  No fever, + chills. Has slept most of the day today under two blankets. No wheezing, no SOB, no CP. Ears hurt. Maxillary sinus pain. Green nasal drainage.  Patient Active Problem List   Diagnosis Date Noted  . HTN (hypertension) 11/02/2012  . Numbness 09/20/2012  . Benign neoplasm of colon 06/08/2010  . Esophageal reflux 04/19/2010  . Dysphagia, unspecified 04/19/2010  Current outpatient prescriptions:aspirin 81 MG tablet, Take 325 mg by mouth daily. , Disp: , Rfl: ;  FLUoxetine (PROZAC) 20 MG capsule, TAKE ONE CAPSULE BY MOUTH EVERY DAY, Disp: 30 capsule, Rfl: 1;  LIVALO 4 MG TABS, 1 tablet by Per post-pyloric tube route at bedtime., Disp: , Rfl: ;  losartan-hydrochlorothiazide (HYZAAR) 100-25 MG per tablet, Take 1 tablet by mouth daily.  , Disp: , Rfl:  omeprazole-sodium bicarbonate (ZEGERID) 40-1100 MG per capsule, TAKE 1 CAPSULE 2 TIMES DAILY BEFORE BREAKFAST AND AT BEDTIME, Disp: 60 capsule, Rfl: 4;  traMADol (ULTRAM) 50 MG tablet, Take 1 tablet by mouth as needed., Disp: , Rfl: ;  verapamil (CALAN-SR) 240 MG CR tablet, Take 240 mg by mouth at bedtime.  , Disp: , Rfl: ; ondansetron (ZOFRAN-ODT) 8 MG disintegrating tablet, , Disp: , Rfl: ;  polycarbophil (FIBERCON) 625 MG tablet, Take 625 mg by mouth daily.  , Disp: , Rfl: ;  predniSONE (DELTASONE) 20 MG tablet, Take 2 tablets by mouth every morning., Disp: , Rfl:  verapamil (CALAN-SR) 240 MG CR tablet, TAKE 1 TABLET (240 MG TOTAL) BY MOUTH AT BEDTIME. NEEDS OFFICE VISIT FOR MORE, Disp: 15 tablet, Rfl: 0    Review of Systems  Constitutional: Positive for chills and fatigue.  HENT: Positive for congestion, ear pain, postnasal drip, rhinorrhea, sinus pressure and sore throat.   Respiratory: Positive for cough. Negative  for chest tightness, shortness of breath and wheezing.   Cardiovascular: Negative for chest pain.  Gastrointestinal: Negative for nausea and vomiting.  Musculoskeletal: Positive for myalgias.  Allergic/Immunologic: Negative for environmental allergies.  Neurological: Positive for headaches.       Objective:   Physical Exam  Nursing note and vitals reviewed. Constitutional: She is oriented to person, place, and time. She appears well-developed and well-nourished.  HENT:  Right Ear: Tympanic membrane, external ear and ear canal normal.  Left Ear: Tympanic membrane, external ear and ear canal normal.  Nose: Mucosal edema and rhinorrhea present. Right sinus exhibits maxillary sinus tenderness and frontal sinus tenderness. Left sinus exhibits maxillary sinus tenderness and frontal sinus tenderness.  Mouth/Throat: Mucous membranes are normal. Oropharyngeal exudate and posterior oropharyngeal erythema present. No posterior oropharyngeal edema or tonsillar abscesses.  Eyes: Conjunctivae are normal. Right eye exhibits no discharge. Left eye exhibits no discharge.  Neck: Normal range of motion. Neck supple.  Cardiovascular: Normal rate, regular rhythm and normal heart sounds.   Pulmonary/Chest: Effort normal and breath sounds normal. No respiratory distress. She has no wheezes. She has no rales.  Musculoskeletal: Normal range of motion. She exhibits no edema.  Lymphadenopathy:    She has no cervical adenopathy.  Neurological: She is alert and oriented to person, place, and time.  Skin: Skin is warm and dry.  Psychiatric: She has a normal mood and affect. Her behavior is normal. Judgment and thought content normal.  Assessment & Plan:  HTN (hypertension)  Pharyngitis, acute  Sinusitis  Meds ordered this encounter  Medications  . amoxicillin (AMOXIL) 875 MG tablet    Sig: Take 1 tablet (875 mg total) by mouth 2 (two) times daily.    Dispense:  20 tablet    Refill:  0  .  HYDROcodone-homatropine (HYCODAN) 5-1.5 MG/5ML syrup    Sig: Take 5 mLs by mouth every 6 (six) hours as needed for cough.    Dispense:  120 mL    Refill:  0   RTC if no improvement in 5-7 days or if worsening symptoms.  I have completed the patient encounter in its entirety as documented by FNP Leone Payor, with editing by me where necessary. Amia Rynders P. Merla Riches, M.D.

## 2012-12-14 ENCOUNTER — Ambulatory Visit (INDEPENDENT_AMBULATORY_CARE_PROVIDER_SITE_OTHER): Payer: BC Managed Care – PPO | Admitting: Neurology

## 2012-12-14 ENCOUNTER — Encounter: Payer: Self-pay | Admitting: Neurology

## 2012-12-14 ENCOUNTER — Encounter (INDEPENDENT_AMBULATORY_CARE_PROVIDER_SITE_OTHER): Payer: Self-pay

## 2012-12-14 VITALS — BP 149/81 | HR 91 | Temp 97.3°F | Ht 67.0 in

## 2012-12-14 DIAGNOSIS — Z8673 Personal history of transient ischemic attack (TIA), and cerebral infarction without residual deficits: Secondary | ICD-10-CM

## 2012-12-14 DIAGNOSIS — R209 Unspecified disturbances of skin sensation: Secondary | ICD-10-CM

## 2012-12-14 DIAGNOSIS — R9089 Other abnormal findings on diagnostic imaging of central nervous system: Secondary | ICD-10-CM

## 2012-12-14 DIAGNOSIS — G4733 Obstructive sleep apnea (adult) (pediatric): Secondary | ICD-10-CM

## 2012-12-14 DIAGNOSIS — I10 Essential (primary) hypertension: Secondary | ICD-10-CM

## 2012-12-14 DIAGNOSIS — R200 Anesthesia of skin: Secondary | ICD-10-CM

## 2012-12-14 DIAGNOSIS — R930 Abnormal findings on diagnostic imaging of skull and head, not elsewhere classified: Secondary | ICD-10-CM

## 2012-12-14 NOTE — Patient Instructions (Addendum)
We will complete her workup with an echocardiogram and a cardiac monitor. If you don't hear back about setting up these tests, call us back and we will follow through. We will call you with the test results. Please see Dr. Vickey Huger for followup of your obstructive sleep apnea once a year and schedule on your way out. I can see you back as needed.  Please continue using your CPAP regularly. While your insurance requires that you use CPAP at least 4 hours each night on 70% of the nights, I recommend, that you not skip any nights and use it throughout the night if you can. Getting used to CPAP does take time and patience and discipline. Untreated obstructive sleep apnea when it is moderate to severe can have an adverse impact on cardiovascular health and raise her risk for heart disease, arrhythmias, hypertension, congestive heart failure, stroke and diabetes. Untreated obstructive sleep apnea causes sleep disruption, nonrestorative sleep, and sleep deprivation. This can have an impact on your day to day functioning and cause daytime sleepiness and impairment of cognitive function, memory loss, mood disturbance, and problems focussing. Using CPAP regularly can improve these symptoms.  As discussed, secondary prevention is key after a stroke. This means: taking care of blood sugar values or diabetes management, good blood pressure (hypertension) control and optimizing cholesterol management, exercising daily or regularly within your own mobility limitations of course and overall cardiovascular risk factor reduction, which includes screening for and treatment of obstructive sleep apnea (OSA).   Discuss the elevated blood sugar value with your PCP next time.

## 2012-12-14 NOTE — Progress Notes (Signed)
Subjective:    Patient ID: Casey Chen is a 57 y.o. female.  HPI   Interim history:   Casey Chen is a very pleasant 57 year old right-handed woman with an underlying medical history of prior stroke, hyperlipidemia, obesity, OSA, arthritis, reflux disease, depression, and hypertension, who presents for followup consultation of her transient facial numbness. I first met her on 09/17/2012 at which time she had had a stroke workup in the form of brain MRI, MRA neck, and MRA head. I repeated her brain MRI with contrast and suggested a cardiac monitor as well as a echocardiogram as she also reported palpitations. Her brain MRI with contrast from 09/26/2012 showed: No abnormal enhancing lesions. Possible small chronic infarct in the left cerebellum. I personally reviewed the images in the computer. She did not have a cardiac monitor nor the echocardiogram done. She says no one called her to schedule these tests. As far as her symptoms are concerned, she feels improved with her facial symptoms but does have some tingling in her left face. She has no new numbness or tingling or speech problems or weakness elsewhere. She tries to use CPAP regularly. She has been seeing Casey Chen for her sleep apnea in our office but has not seen her in over a year she states. She has no new complaints. She had her flu shot for this year. She tries to exercise regularly including water aerobics, using a stationary bike, and weight training. Unfortunately, she has gained a few pounds lately.  Her vascular risk factors include prior stroke, OSA, hyperlipidemia, hypertension and she also reports a history of binocular very brief vision loss in the distant past. She had an episode of transient facial numbness which start in September. She was also experiencing a frontal headache and the maxillary sinus region. She had a brain MRI without contrast on 09/06/2012 which showed moderate nonspecific hemispheric white matter changes,  appearing advanced for age. Diffusion imaging was negative for acute infarct. 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 Her cardiovascular risk factors include stroke in 2005 with LUE weakness, smoking - she quit some 15-20 years ago and HTN and HLP and she also has OSA and uses a CPAP.  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 She had an MRA head on 09/07/2012 which showed developmental variants of the posterior circulation, otherwise negative study without significant stenosis, aneurysm or vascular malformation.  She has had palpitations for years. She had been on a baby aspirin since her stroke in 2005 but increased this recently to a adult size aspirin daily since her new facial numbness.  Her Past Medical History Is Significant For: Past Medical History  Diagnosis Date  . GERD (gastroesophageal reflux disease)   . Head ache   . Depression   . Esophageal stricture   . Hyperlipidemia   . Hypertension   . Stroke     TIA    Her Past Surgical History Is Significant For: Past Surgical History  Procedure Laterality Date  . Umbilical hernia repair    . Esophagogastroduodenoscopy    . Tubal ligation      Her Family History Is Significant For: Family History  Problem Relation Age of Onset  . Colon polyps Mother     father  . Irritable bowel syndrome Mother   . Lung cancer Mother   . Brain cancer Brother   . Diabetes Sister   .  Heart disease Father   . Colon cancer Neg Hx     Her Social History Is Significant For: History   Social History  . Marital Status: Married    Spouse Name: N/A    Number of Children: 2  . Years of Education: N/A   Occupational History  . bookkeeper    Social History Main Topics  . Smoking status: Former Smoker -- 1.00 packs/day    Types: Cigarettes    Quit date: 01/10/1990  . Smokeless tobacco:  Never Used  . Alcohol Use: 0.5 oz/week    1 drink(s) per week  . Drug Use: No  . Sexual Activity: None   Other Topics Concern  . None   Social History Narrative  . None    Her Allergies Are:  Allergies  Allergen Reactions  . Tetanus Toxoids Swelling  :   Her Current Medications Are:  Outpatient Encounter Prescriptions as of 12/14/2012  Medication Sig  . aspirin 325 MG tablet Take 325 mg by mouth daily.  Marland Kitchen FLUoxetine (PROZAC) 20 MG capsule TAKE ONE CAPSULE BY MOUTH EVERY DAY  . losartan-hydrochlorothiazide (HYZAAR) 100-25 MG per tablet Take 1 tablet by mouth daily.    Marland Kitchen omeprazole-sodium bicarbonate (ZEGERID) 40-1100 MG per capsule TAKE 1 CAPSULE 2 TIMES DAILY BEFORE BREAKFAST AND AT BEDTIME  . traMADol (ULTRAM) 50 MG tablet Take 1 tablet by mouth as needed.  . verapamil (CALAN-SR) 240 MG CR tablet TAKE 1 TABLET (240 MG TOTAL) BY MOUTH AT BEDTIME. NEEDS OFFICE VISIT FOR MORE  . [DISCONTINUED] amoxicillin (AMOXIL) 875 MG tablet Take 1 tablet (875 mg total) by mouth 2 (two) times daily.  . [DISCONTINUED] aspirin 81 MG tablet Take 325 mg by mouth daily.   . [DISCONTINUED] HYDROcodone-homatropine (HYCODAN) 5-1.5 MG/5ML syrup Take 5 mLs by mouth every 6 (six) hours as needed for cough.  . [DISCONTINUED] LIVALO 4 MG TABS 1 tablet by Per post-pyloric tube route at bedtime.  . [DISCONTINUED] ondansetron (ZOFRAN-ODT) 8 MG disintegrating tablet   . [DISCONTINUED] polycarbophil (FIBERCON) 625 MG tablet Take 625 mg by mouth daily.    . [DISCONTINUED] predniSONE (DELTASONE) 20 MG tablet Take 2 tablets by mouth every morning.  . [DISCONTINUED] verapamil (CALAN-SR) 240 MG CR tablet Take 240 mg by mouth at bedtime.     Review of Systems:   Out of a complete 14 point review of systems, all are reviewed and negative with the exception of these symptoms as listed below:   Review of Systems  Constitutional: Negative.   HENT: Negative.   Eyes: Negative.   Respiratory: Positive for apnea.         Snoring  Cardiovascular: Negative.   Gastrointestinal: Negative.   Endocrine: Negative.   Genitourinary: Positive for enuresis.  Musculoskeletal: Negative.   Skin: Negative.   Allergic/Immunologic: Negative.   Neurological: Negative.   Hematological: Negative.   Psychiatric/Behavioral: Positive for dysphoric mood and agitation.    Objective:  Neurologic Exam  Physical Exam Physical Examination:   Filed Vitals:   12/14/12 1211  BP: 149/81  Pulse: 91  Temp: 97.3 F (36.3 C)   General Examination: The patient is a very pleasant 57 y.o. female in no acute distress. She appears well-developed and well-nourished and well groomed. She is mildly anxious appearing.  HEENT: Normocephalic, atraumatic, pupils are equal, round and reactive to light and accommodation. Extraocular tracking is good without limitation to gaze excursion or nystagmus noted. Normal smooth pursuit is noted. Hearing is grossly intact. Face is symmetric with normal  facial animation and normal facial sensation with the exception of some tingling reported in her left upper face. Speech is clear with no dysarthria noted. There is no hypophonia. There is no lip, neck/head, jaw or voice tremor. Neck is supple with full range of passive and active motion. There are no carotid bruits on auscultation. Oropharynx exam reveals: mild mouth dryness, good dental hygiene and mild airway crowding. Mallampati is class II. Tongue protrudes centrally and palate elevates symmetrically.   Chest: Clear to auscultation without wheezing, rhonchi or crackles noted.  Heart: S1+S2+0, regular and normal without murmurs, rubs or gallops noted.   Abdomen: Soft, non-tender and non-distended with normal bowel sounds appreciated on auscultation.  Extremities: There is no pitting edema in the distal lower extremities bilaterally. Pedal pulses are intact.  Skin: Warm and dry without trophic changes noted. There are no varicose  veins.  Musculoskeletal: exam reveals no obvious joint deformities, tenderness or joint swelling or erythema.   Neurologically:  Mental status: The patient is awake, alert and oriented in all 4 spheres. Her memory, attention, language and knowledge are appropriate. There is no aphasia, agnosia, apraxia or anomia. Speech is clear with normal prosody and enunciation. Thought process is linear. Mood is congruent and affect is normal.  Cranial nerves are as described above under HEENT exam. In addition, shoulder shrug is normal with equal shoulder height noted. Motor exam: Normal bulk, strength and tone is noted. There is no drift, tremor or rebound. Romberg is negative. Reflexes are 2+ throughout. Fine motor skills are intact with normal finger taps, normal hand movements, normal rapid alternating patting, normal foot taps and normal foot agility.  Cerebellar testing shows no dysmetria or intention tremor on finger to nose testing. Heel to shin is unremarkable bilaterally. There is no truncal or gait ataxia.  Sensory exam is intact to light touch, pinprick, vibration, temperature sense and proprioception in the upper and lower extremities.  Gait, station and balance are unremarkable. No veering to one side is noted. No leaning to one side is noted. Posture is age-appropriate and stance is narrow based. No problems turning are noted. She turns en bloc. Tandem walk is unremarkable. Intact toe and heel stance is noted.               Assessment and Plan:   In summary, Casey Chen is a very pleasant 57 y.o.-year old female with a history of new onset left facial numbness and tingling, in the absence of any other neurological complaints. She has improve and her exam is improved as well. She had a brain MRI without contrast, head and neck MRA and a repeat MRI brain with contrast. Brain MRI showed extensive white matter changes and I reviewed the scan And her contrasted MRI did not show any enhancements. I did  not appreciate any findings consistent with Dawson's fingers. She does have quite a number of vascular risk factors including a prior stroke, OSA, hyperlipidemia, hypertension. I would like to pursue completion of a vascular workup with an echocardiogram and cardiac monitor because she also reports intermittent palpitations.  these tests were not scheduled last time. Her physical exam is non-focal with the exceptionsome residual facial tingling on the left. Blood work had shown increase in glucose level and increase in hemoglobin A1c. She may be borderline diabetic. She has a followup appointment with her primary care physician in one month. She has blood work later this month with her PCP. We talked about secondary stroke prevention at length  today including continuation with adult size aspirin, full CPAP compliance, regular exercise, weight management, blood sugar, blood pressure, and blood lipid management. She is advised to followup with me on an as-needed basis if her test results come back negative. I will call her with her echocardiogram and cardiac monitor results. She is advised to follow up routinely with Casey Chen for her OSA. She was in agreement. Most of my 25 minute visit today was spent in counseling and coordination of care, reviewing test results and reviewing medication.

## 2013-01-07 ENCOUNTER — Encounter: Payer: Self-pay | Admitting: Cardiology

## 2013-01-07 ENCOUNTER — Encounter (INDEPENDENT_AMBULATORY_CARE_PROVIDER_SITE_OTHER): Payer: Managed Care, Other (non HMO)

## 2013-01-07 ENCOUNTER — Encounter: Payer: Self-pay | Admitting: *Deleted

## 2013-01-07 ENCOUNTER — Other Ambulatory Visit (HOSPITAL_COMMUNITY): Payer: Self-pay | Admitting: Neurology

## 2013-01-07 ENCOUNTER — Ambulatory Visit (HOSPITAL_COMMUNITY): Payer: Managed Care, Other (non HMO) | Attending: Cardiology | Admitting: Cardiology

## 2013-01-07 DIAGNOSIS — E785 Hyperlipidemia, unspecified: Secondary | ICD-10-CM

## 2013-01-07 DIAGNOSIS — Z8673 Personal history of transient ischemic attack (TIA), and cerebral infarction without residual deficits: Secondary | ICD-10-CM

## 2013-01-07 DIAGNOSIS — Z6831 Body mass index (BMI) 31.0-31.9, adult: Secondary | ICD-10-CM | POA: Insufficient documentation

## 2013-01-07 DIAGNOSIS — R002 Palpitations: Secondary | ICD-10-CM

## 2013-01-07 DIAGNOSIS — R9089 Other abnormal findings on diagnostic imaging of central nervous system: Secondary | ICD-10-CM

## 2013-01-07 DIAGNOSIS — I079 Rheumatic tricuspid valve disease, unspecified: Secondary | ICD-10-CM | POA: Insufficient documentation

## 2013-01-07 DIAGNOSIS — I10 Essential (primary) hypertension: Secondary | ICD-10-CM

## 2013-01-07 DIAGNOSIS — I6789 Other cerebrovascular disease: Secondary | ICD-10-CM

## 2013-01-07 DIAGNOSIS — I6350 Cerebral infarction due to unspecified occlusion or stenosis of unspecified cerebral artery: Secondary | ICD-10-CM

## 2013-01-07 DIAGNOSIS — G4733 Obstructive sleep apnea (adult) (pediatric): Secondary | ICD-10-CM

## 2013-01-07 DIAGNOSIS — E669 Obesity, unspecified: Secondary | ICD-10-CM | POA: Insufficient documentation

## 2013-01-07 DIAGNOSIS — R200 Anesthesia of skin: Secondary | ICD-10-CM

## 2013-01-07 NOTE — Progress Notes (Signed)
Echo performed. 

## 2013-01-07 NOTE — Progress Notes (Signed)
Patient ID: Casey Chen, female   DOB: 16-Feb-1955, 57 y.o.   MRN: [id number] [employer] 30 day cardiac event monitor applied to patient.

## 2013-01-14 NOTE — Progress Notes (Signed)
Quick Note:  Please advise patient that her recent echocardiogram which is the ultrasound of her heart was essentially within normal or expected findings. There is no further action required on this test at this time. Star Age, MD, PhD [facility] ([facility]  ______

## 2013-02-18 ENCOUNTER — Encounter: Payer: Self-pay | Admitting: Nurse Practitioner

## 2013-02-19 ENCOUNTER — Ambulatory Visit: Payer: BC Managed Care – PPO | Admitting: Nurse Practitioner

## 2013-03-18 ENCOUNTER — Telehealth: Payer: Self-pay | Admitting: *Deleted

## 2013-03-18 NOTE — Telephone Encounter (Signed)
[phone]   Faxed over form to [facility]

## 2013-03-19 ENCOUNTER — Ambulatory Visit (INDEPENDENT_AMBULATORY_CARE_PROVIDER_SITE_OTHER): Payer: Managed Care, Other (non HMO) | Admitting: Nurse Practitioner

## 2013-03-19 ENCOUNTER — Encounter: Payer: Self-pay | Admitting: Nurse Practitioner

## 2013-03-19 VITALS — BP 140/76 | HR 92 | Resp 20 | Ht 66.5 in | Wt 203.0 lb

## 2013-03-19 DIAGNOSIS — F329 Major depressive disorder, single episode, unspecified: Secondary | ICD-10-CM

## 2013-03-19 DIAGNOSIS — I10 Essential (primary) hypertension: Secondary | ICD-10-CM

## 2013-03-19 DIAGNOSIS — E785 Hyperlipidemia, unspecified: Secondary | ICD-10-CM

## 2013-03-19 DIAGNOSIS — F3289 Other specified depressive episodes: Secondary | ICD-10-CM

## 2013-03-19 DIAGNOSIS — Z780 Asymptomatic menopausal state: Secondary | ICD-10-CM | POA: Insufficient documentation

## 2013-03-19 DIAGNOSIS — Z01419 Encounter for gynecological examination (general) (routine) without abnormal findings: Secondary | ICD-10-CM

## 2013-03-19 DIAGNOSIS — Z8719 Personal history of other diseases of the digestive system: Secondary | ICD-10-CM

## 2013-03-19 DIAGNOSIS — F32A Depression, unspecified: Secondary | ICD-10-CM

## 2013-03-19 DIAGNOSIS — K219 Gastro-esophageal reflux disease without esophagitis: Secondary | ICD-10-CM

## 2013-03-19 DIAGNOSIS — G459 Transient cerebral ischemic attack, unspecified: Secondary | ICD-10-CM

## 2013-03-19 NOTE — Progress Notes (Signed)
58 y.o. [id number] Married Caucasian Fe here for annual exam. PCP thought she may have had another TIA in August 2014.  Numbness left side of head and some of face. Now residual at temporal areas.  She had normal MRI and Neuro evaluation. No vaginal dryness. Some vaso symptoms.  Patient's last menstrual period was 01/11/2007.  She had been on Depo Provera X 4 years prior to stopping in 2009        Sexually active: yes  The current method of family planning is none.    Exercising: yes  Strength, Cardio Smoker:  Former  Health Maintenance: Pap:  02/14/2012 - Neg. High Risk HPV:Neg MMG:  07/2012. -Normal- Per patient Colonoscopy: 2012, every 3 years BMD:   none TDaP:  Pt is allergic  Labs: PCP. Urine: PCP   reports that she quit smoking about 23 years ago. Her smoking use included Cigarettes. She has a 13 pack-year smoking history. She has never used smokeless tobacco. She reports that she drinks about 0.5 ounces of alcohol per week. She reports that she does not use illicit drugs.  Past Medical History  Diagnosis Date  . GERD (gastroesophageal reflux disease)   . Head ache   . Depression   . Esophageal stricture 2011  . Hyperlipidemia   . Hypertension   . Stroke     TIA on OCP, NO ESTROGEN    Past Surgical History  Procedure Laterality Date  . Umbilical hernia repair  1979  . Esophagogastroduodenoscopy    . Tubal ligation  1979    Current Outpatient Prescriptions  Medication Sig Dispense Refill  . aspirin 325 MG tablet Take 325 mg by mouth daily.      Marland Kitchen BENICAR HCT 20-12.5 MG per tablet Take 1 tablet by mouth daily.       Marland Kitchen FLUoxetine (PROZAC) 20 MG capsule TAKE ONE CAPSULE BY MOUTH EVERY DAY  30 capsule  1  . LIVALO 4 MG TABS Take 1 tablet by mouth daily.       Marland Kitchen omeprazole-sodium bicarbonate (ZEGERID) 40-1100 MG per capsule TAKE 1 CAPSULE 2 TIMES DAILY BEFORE BREAKFAST AND AT BEDTIME  60 capsule  4  . traMADol (ULTRAM) 50 MG tablet Take 1 tablet by mouth as needed.      .  verapamil (CALAN-SR) 240 MG CR tablet TAKE 1 TABLET (240 MG TOTAL) BY MOUTH AT BEDTIME. NEEDS OFFICE VISIT FOR MORE  15 tablet  0   No current facility-administered medications for this visit.    Family History  Problem Relation Age of Onset  . Colon polyps Mother     father  . Irritable bowel syndrome Mother   . Lung cancer Mother   . Multiple births Mother     one set of twins  . Depression Mother   . Brain cancer Brother   . Diabetes Sister   . Heart disease Father   . Colon cancer Neg Hx     ROS:  Pertinent items are noted in HPI.  Otherwise, a comprehensive ROS was negative.  Exam:   BP 140/76  Pulse 92  Resp 20  Ht 5' 6.5" (1.689 m)  Wt 203 lb (92.08 kg)  BMI 32.28 kg/m2  LMP 01/11/2007 Height: 5' 6.5" (168.9 cm)  Ht Readings from Last 3 Encounters:  03/19/13 5' 6.5" (1.689 m)  12/14/12 5\' 7"  (1.702 m)  11/02/12 5' 7.5" (1.715 m)    General appearance: alert, cooperative and appears stated age Head: Normocephalic, without obvious abnormality, atraumatic Neck:  no adenopathy, supple, symmetrical, trachea midline and thyroid normal to inspection and palpation Lungs: clear to auscultation bilaterally Breasts: normal appearance, no masses or tenderness Heart: regular rate and rhythm Abdomen: soft, non-tender; no masses,  no organomegaly Extremities: extremities normal, atraumatic, no cyanosis or edema Skin: Skin color, texture, turgor normal. No rashes or lesions Lymph nodes: Cervical, supraclavicular, and axillary nodes normal. No abnormal inguinal nodes palpated Neurologic: Grossly normal   Pelvic: External genitalia:  no lesions              Urethra:  normal appearing urethra with no masses, tenderness or lesions              Bartholin's and Skene's: normal                 Vagina: normal appearing vagina with normal color and discharge, no lesions              Cervix: anteverted              Pap taken: no Bimanual Exam:  Uterus:  normal size, contour,  position, consistency, mobility, non-tender              Adnexa: no mass, fullness, tenderness               Rectovaginal: Confirms               Anus:  normal sphincter tone, no lesions  A:  Well Woman with normal exam  postmenopausal  History of TIA on OCP   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

## 2013-03-19 NOTE — Patient Instructions (Signed)

## 2013-03-20 NOTE — Telephone Encounter (Signed)
Faxed over second form today

## 2013-03-21 NOTE — Telephone Encounter (Signed)
Zegerid is not covered  Called to inform pt. Left mwedssage

## 2013-03-22 NOTE — Progress Notes (Signed)
Encounter reviewed by Dr. Brook Silva.  

## 2013-04-15 ENCOUNTER — Encounter: Payer: Self-pay | Admitting: Gastroenterology

## 2013-04-23 ENCOUNTER — Encounter: Payer: Self-pay | Admitting: Gastroenterology

## 2013-05-14 ENCOUNTER — Encounter (HOSPITAL_COMMUNITY): Payer: Self-pay | Admitting: Emergency Medicine

## 2013-05-14 ENCOUNTER — Emergency Department (HOSPITAL_COMMUNITY)
Admission: EM | Admit: 2013-05-14 | Discharge: 2013-05-14 | Disposition: A | Discharge status: Home / Self Care | Payer: Managed Care, Other (non HMO) | Attending: Emergency Medicine | Admitting: Emergency Medicine

## 2013-05-14 ENCOUNTER — Emergency Department (HOSPITAL_COMMUNITY): Payer: Managed Care, Other (non HMO)

## 2013-05-14 DIAGNOSIS — I10 Essential (primary) hypertension: Secondary | ICD-10-CM | POA: Insufficient documentation

## 2013-05-14 DIAGNOSIS — Z87891 Personal history of nicotine dependence: Secondary | ICD-10-CM | POA: Insufficient documentation

## 2013-05-14 DIAGNOSIS — R110 Nausea: Secondary | ICD-10-CM | POA: Insufficient documentation

## 2013-05-14 DIAGNOSIS — Z79899 Other long term (current) drug therapy: Secondary | ICD-10-CM | POA: Insufficient documentation

## 2013-05-14 DIAGNOSIS — Z8673 Personal history of transient ischemic attack (TIA), and cerebral infarction without residual deficits: Secondary | ICD-10-CM | POA: Insufficient documentation

## 2013-05-14 DIAGNOSIS — Z862 Personal history of diseases of the blood and blood-forming organs and certain disorders involving the immune mechanism: Secondary | ICD-10-CM | POA: Insufficient documentation

## 2013-05-14 DIAGNOSIS — M79609 Pain in unspecified limb: Secondary | ICD-10-CM | POA: Insufficient documentation

## 2013-05-14 DIAGNOSIS — Z8639 Personal history of other endocrine, nutritional and metabolic disease: Secondary | ICD-10-CM | POA: Insufficient documentation

## 2013-05-14 DIAGNOSIS — F3289 Other specified depressive episodes: Secondary | ICD-10-CM | POA: Insufficient documentation

## 2013-05-14 DIAGNOSIS — Z7982 Long term (current) use of aspirin: Secondary | ICD-10-CM | POA: Insufficient documentation

## 2013-05-14 DIAGNOSIS — M79603 Pain in arm, unspecified: Secondary | ICD-10-CM

## 2013-05-14 DIAGNOSIS — F329 Major depressive disorder, single episode, unspecified: Secondary | ICD-10-CM | POA: Insufficient documentation

## 2013-05-14 DIAGNOSIS — K219 Gastro-esophageal reflux disease without esophagitis: Secondary | ICD-10-CM | POA: Insufficient documentation

## 2013-05-14 LAB — CBC
HCT: 38.6 % (ref 36.0–46.0)
HEMOGLOBIN: 13 g/dL (ref 12.0–15.0)
MCH: 30.8 pg (ref 26.0–34.0)
MCHC: 33.7 g/dL (ref 30.0–36.0)
MCV: 91.5 fL (ref 78.0–100.0)
Platelets: 225 10*3/uL (ref 150–400)
RBC: 4.22 MIL/uL (ref 3.87–5.11)
RDW: 13.8 % (ref 11.5–15.5)
WBC: 6.8 10*3/uL (ref 4.0–10.5)

## 2013-05-14 LAB — I-STAT CHEM 8, ED
BUN: 16 mg/dL (ref 6–23)
CREATININE: 0.9 mg/dL (ref 0.50–1.10)
Calcium, Ion: 1.13 mmol/L (ref 1.12–1.23)
Chloride: 106 mEq/L (ref 96–112)
Glucose, Bld: 103 mg/dL — ABNORMAL HIGH (ref 70–99)
HCT: 40 % (ref 36.0–46.0)
HEMOGLOBIN: 13.6 g/dL (ref 12.0–15.0)
Potassium: 3.7 mEq/L (ref 3.7–5.3)
SODIUM: 141 meq/L (ref 137–147)
TCO2: 23 mmol/L (ref 0–100)

## 2013-05-14 LAB — I-STAT TROPONIN, ED: Troponin i, poc: 0 ng/mL (ref 0.00–0.08)

## 2013-05-14 MED ORDER — OXYCODONE-ACETAMINOPHEN 5-325 MG PO TABS: 1.0000 | ORAL_TABLET | Freq: Four times a day (QID) | ORAL | Status: DC | PRN

## 2013-05-14 MED ORDER — OXYCODONE-ACETAMINOPHEN 5-325 MG PO TABS
1.0000 | ORAL_TABLET | Freq: Once | ORAL | Status: AC
  Administered 2013-05-14: 1 via ORAL
  Filled 2013-05-14: qty 1

## 2013-05-14 MED ORDER — NAPROXEN 500 MG PO TABS: 500.0000 mg | ORAL_TABLET | Freq: Two times a day (BID) | ORAL | Status: DC

## 2013-05-14 MED ORDER — PREDNISONE 20 MG PO TABS
60.0000 mg | ORAL_TABLET | Freq: Once | ORAL | Status: AC
  Administered 2013-05-14: 60 mg via ORAL
  Filled 2013-05-14: qty 3

## 2013-05-14 MED ORDER — PREDNISONE 20 MG PO TABS: ORAL_TABLET | ORAL | Status: DC

## 2013-05-14 MED ORDER — DIAZEPAM 5 MG PO TABS: 5.0000 mg | ORAL_TABLET | Freq: Two times a day (BID) | ORAL | Status: DC

## 2013-05-14 MED ORDER — DIAZEPAM 5 MG PO TABS
5.0000 mg | ORAL_TABLET | Freq: Once | ORAL | Status: AC
  Administered 2013-05-14: 5 mg via ORAL
  Filled 2013-05-14: qty 1

## 2013-05-14 NOTE — ED Notes (Signed)
Josh, PA at the bedside  

## 2013-05-14 NOTE — ED Provider Notes (Signed)
CSN: [id number]     Arrival date & time 05/14/13  8101 History   First MD Initiated Contact with Patient 05/14/13 [phone]     Chief Complaint  Patient presents with  . Arm Pain  . Nausea     (Consider location/radiation/quality/duration/timing/severity/associated sxs/prior Treatment) HPI Comments: Patient with history of hypertension and hypercholesterolemia, remote negative cardiac catheterization -- presents with complaint of a shooting "electrical" pain down her left arm which began at approximately 9:05 PM last night after participating in water aerobics. No injury at onset. Patient has had similar pain in the distant past. Patient states that at that time she had a workup for CAD and had a negative catheterization. No numbness, tingling, or weakness in her arm. The pain goes from her shoulder down into her middle finger. She has nausea pain but no vomiting. She denies palpitations, chest pain, shortness of breath. No back pain. No fever, cough.  Patient is a 58 y.o. female presenting with arm pain. The history is provided by the patient.  Arm Pain Associated symptoms include arthralgias. Pertinent negatives include no abdominal pain, chest pain, coughing, diaphoresis, fever, headaches, nausea, neck pain, numbness, rash or vomiting.    Past Medical History  Diagnosis Date  . GERD (gastroesophageal reflux disease)   . Head ache   . Depression   . Esophageal stricture 2011  . Hyperlipidemia   . Hypertension   . Stroke     TIA on OCP, NO ESTROGEN   Past Surgical History  Procedure Laterality Date  . Umbilical hernia repair  1979  . Esophagogastroduodenoscopy    . Tubal ligation  1979   Family History  Problem Relation Age of Onset  . Colon polyps Mother     father  . Irritable bowel syndrome Mother   . Lung cancer Mother   . Multiple births Mother     one set of twins  . Depression Mother   . Brain cancer Brother   . Diabetes Sister   . Heart disease Father   . Colon  cancer Neg Hx    History  Substance Use Topics  . Smoking status: Former Smoker -- 1.00 packs/day for 13 years    Types: Cigarettes    Quit date: 01/10/1990  . Smokeless tobacco: Never Used  . Alcohol Use: 0.5 oz/week    1 drink(s) per week   OB History   Grav Para Term Preterm Abortions TAB SAB Ect Mult Living   3 2 2  1  1   2      Review of Systems  Constitutional: Negative for fever and diaphoresis.  Eyes: Negative for redness.  Respiratory: Negative for cough and shortness of breath.   Cardiovascular: Negative for chest pain, palpitations and leg swelling.  Gastrointestinal: Negative for nausea, vomiting and abdominal pain.  Genitourinary: Negative for dysuria.  Musculoskeletal: Positive for arthralgias. Negative for back pain and neck pain.  Skin: Negative for rash.  Neurological: Negative for syncope, light-headedness, numbness and headaches.   Allergies  Tetanus toxoids  Home Medications   Prior to Admission medications   Medication Sig Start Date End Date Taking? Authorizing Provider  aspirin 325 MG tablet Take 325 mg by mouth daily.    Historical Provider, MD  BENICAR HCT 20-12.5 MG per tablet Take 1 tablet by mouth daily.  02/18/13   Historical Provider, MD  FLUoxetine (PROZAC) 20 MG capsule TAKE ONE CAPSULE BY MOUTH EVERY DAY 09/28/11   Rise Mu, PA-C  LIVALO 4 MG TABS Take  1 tablet by mouth daily.  02/18/13   Historical Provider, MD  omeprazole-sodium bicarbonate (ZEGERID) 40-1100 MG per capsule TAKE 1 CAPSULE 2 TIMES DAILY BEFORE BREAKFAST AND AT BEDTIME 07/21/12   Inda Castle, MD  traMADol (ULTRAM) 50 MG tablet Take 1 tablet by mouth as needed. 08/23/12   Historical Provider, MD  verapamil (CALAN-SR) 240 MG CR tablet TAKE 1 TABLET (240 MG TOTAL) BY MOUTH AT BEDTIME. NEEDS OFFICE VISIT FOR MORE 08/13/11   Mancel Bale, PA-C   BP 125/64  Pulse 81  Temp(Src) 98.4 F (36.9 C) (Oral)  Resp 16  Ht 5\' 7"  (1.702 m)  Wt 109 lb (49.442 kg)  BMI 17.07 kg/m2  SpO2  100%  LMP 01/11/2007  Physical Exam  Nursing note and vitals reviewed. Constitutional: She appears well-developed and well-nourished.  HENT:  Head: Normocephalic and atraumatic.  Mouth/Throat: Mucous membranes are normal. Mucous membranes are not dry.  Eyes: Conjunctivae are normal.  Neck: Trachea normal and normal range of motion. Neck supple. Normal carotid pulses and no JVD present. No muscular tenderness present. Carotid bruit is not present. No tracheal deviation present.  Cardiovascular: Normal rate, regular rhythm, S1 normal, S2 normal, normal heart sounds and intact distal pulses.  Exam reveals no decreased pulses.   No murmur heard. Pulmonary/Chest: Effort normal. No respiratory distress. She has no wheezes. She exhibits no tenderness.  Abdominal: Soft. Normal aorta and bowel sounds are normal. There is no tenderness. There is no rebound and no guarding.  Musculoskeletal: Normal range of motion.       Right shoulder: Normal.       Left shoulder: Normal.       Left elbow: Normal.       Left wrist: Normal.       Cervical back: Normal.       Left upper arm: Normal.       Left forearm: Normal.       Left hand: Normal.  Pain is constant with intermittent sharper pains. Not made worse with movement.   Neurological: She is alert. She has normal strength. No sensory deficit. She exhibits normal muscle tone.  Skin: Skin is warm and dry. She is not diaphoretic. No cyanosis. No pallor.  Psychiatric: She has a normal mood and affect.    ED Course  Procedures (including critical care time) Labs Review Labs Reviewed  I-STAT CHEM 8, ED - Abnormal; Notable for the following:    Glucose, Bld 103 (*)    All other components within normal limits  CBC  I-STAT TROPOININ, ED    Imaging Review Dg Chest 2 View  05/14/2013   CLINICAL DATA:  Left arm pain and nausea.  Hypertension.  EXAM: CHEST  2 VIEW  COMPARISON:  03/24/2003  FINDINGS: The heart size and mediastinal contours are within  normal limits. Both lungs are clear. The visualized skeletal structures are unremarkable.  IMPRESSION: No active cardiopulmonary disease.   Electronically Signed   By: Kalman Jewels M.D.   On: 05/14/2013 10:06       9:12 AM Patient seen and examined. Work-up initiated. Medications ordered.   Vital signs reviewed and are as follows: Filed Vitals:   05/14/13 0848  BP: 125/64  Pulse: 81  Temp: 98.4 F (36.9 C)  Resp: 16   Patient seen by Dr. Kathrynn Humble. Will treat as cervical radiculopathy, have patient f/u with her PCP.   Pt informed of all results.   No red flag s/s of back pain. Patient was  counseled on back pain precautions and told to do activity as tolerated but do not lift, push, or pull heavy objects more than 10 pounds for the next week.  Patient counseled to use ice or heat on back for no longer than 15 minutes every hour.   Patient prescribed muscle relaxer and counseled on proper use of muscle relaxant medication.    Patient prescribed narcotic pain medicine and counseled on proper use of narcotic pain medications. Counseled not to combine this medication with others containing tylenol.   Urged patient not to drink alcohol, drive, or perform any other activities that requires focus while taking either of these medications.  Patient urged to follow-up with PCP if pain does not improve with treatment and rest or if pain becomes recurrent. Urged to return with worsening severe pain, loss of bowel or bladder control, trouble walking.   Patient was counseled to return with severe chest pain, especially if the pain is crushing or pressure-like and spreads to the arms, back, neck, or jaw, or if they have sweating, nausea, or shortness of breath with the pain. They were encouraged to call 911 with these symptoms.   They were also told to return if their chest pain does not go away with rest, they have an attack of chest pain lasting longer than usual despite rest and treatment with  the medications their caregiver has prescribed, if they wake from sleep with chest pain or shortness of breath, if they feel dizzy or faint, if they have chest pain not typical of their usual pain, or if they have any other emergent concerns regarding their health.  The patient verbalized understanding and agreed.    MDM   Final diagnoses:  Arm pain   Patient with L arm pain, characteristics and exam suggestive of radicular pain. Cardiac work-up is negative. Do not suspect ACS. Conservative management indicated with PCP f/u as needed.     Carlisle Cater, PA-C 05/14/13 1555

## 2013-05-14 NOTE — ED Notes (Signed)
Pt returned from X-ray.  

## 2013-05-14 NOTE — Discharge Instructions (Signed)
Please read and follow all provided instructions.  Your diagnoses today include:  1. Arm pain     Tests performed today include:  Vital signs - see below for your results today  Medications prescribed:   Percocet (oxycodone/acetaminophen) - narcotic pain medication  DO NOT drive or perform any activities that require you to be awake and alert because this medicine can make you drowsy. BE VERY CAREFUL not to take multiple medicines containing Tylenol (also called acetaminophen). Doing so can lead to an overdose which can damage your liver and cause liver failure and possibly death.   Valium - muscle relaxer medication  DO NOT drive or perform any activities that require you to be awake and alert because this medicine can make you drowsy.    Naproxen - anti-inflammatory pain medication  Do not exceed 500mg  naproxen every 12 hours, take with food  You have been prescribed an anti-inflammatory medication or NSAID. Take with food. Take smallest effective dose for the shortest duration needed for your pain. Stop taking if you experience stomach pain or vomiting.    Prednisone - steroid medicine   It is best to take this medication in the morning to prevent sleeping problems. If you are diabetic, monitor your blood sugar closely and stop taking Prednisone if blood sugar is over 300. Take with food to prevent stomach upset.   Take any prescribed medications only as directed.  Home care instructions:   Follow any educational materials contained in this packet  Please rest, use ice or heat on your back for the next several days  Do not lift, push, pull anything more than 10 pounds for the next week  Follow-up instructions: Please follow-up with your primary care provider in the next 1 week for further evaluation of your symptoms. If you do not have a primary care doctor -- see below for referral information.   Return instructions:  SEEK IMMEDIATE MEDICAL ATTENTION IF YOU  HAVE:  New numbness, tingling, weakness, or problem with the use of your arms or legs  Severe back pain not relieved with medications  Loss control of your bowels or bladder  Increasing pain in any areas of the body (such as chest or abdominal pain)  Shortness of breath, dizziness, or fainting.   Worsening nausea (feeling sick to your stomach), vomiting, fever, or sweats  Any other emergent concerns regarding your health   Additional Information:  Your vital signs today were: BP 101/54   Pulse 82   Temp(Src) 98.4 F (36.9 C) (Oral)   Resp 15   Ht 5\' 7"  (1.702 m)   Wt 109 lb (49.442 kg)   BMI 17.07 kg/m2   SpO2 94%   LMP 01/11/2007 If your blood pressure (BP) was elevated above 135/85 this visit, please have this repeated by your doctor within one month. --------------

## 2013-05-14 NOTE — ED Notes (Signed)
To ED for eval of left arm pain and nausea since last pm. Pt states the pain is deep pain and feels electrical-like. Mae x4 freely. She's had this pain before and resulted in cardiac cath. No CP

## 2013-05-15 NOTE — ED Provider Notes (Signed)
Medical screening examination/treatment/procedure(s) were conducted as a shared visit with non-physician practitioner(s) and myself.  I personally evaluated the patient during the encounter.   EKG Interpretation   Date/Time:  Tuesday May 14 2013 08:41:35 EDT Ventricular Rate:  85 PR Interval:  142 QRS Duration: 88 QT Interval:  382 QTC Calculation: 454 R Axis:   52 Text Interpretation:  Normal sinus rhythm Normal ECG Confirmed by  Sola Margolis, MD, Ercel Pepitone ([id number]) on 05/14/2013 9:19:27 AM       Pt comes in with cc of left arm pain. Pain since last night, sudden onset, described as "electrical", "dull, throbbing" pain. No chest pain. Pt's pain comes on for a few seconds and resolves, and the sx have been recurrent, every few min. Neuro exam is fine, vascular exam is normal. Pain is not positional, reproducible, and neuro exam is normal. Not sure what the etiology is. We have advised RICE tx,and pcp f.u in a week if not better. Return to the ER if the sx change, or get worse.  Varney Biles, MD 05/15/13 [phone]

## 2013-06-11 ENCOUNTER — Ambulatory Visit (AMBULATORY_SURGERY_CENTER): Payer: Self-pay

## 2013-06-11 VITALS — Ht 67.0 in | Wt 201.0 lb

## 2013-06-11 DIAGNOSIS — Z86010 Personal history of colonic polyps: Secondary | ICD-10-CM

## 2013-06-11 DIAGNOSIS — Z8371 Family history of colonic polyps: Secondary | ICD-10-CM

## 2013-06-11 MED ORDER — NA SULFATE-K SULFATE-MG SULF 17.5-3.13-1.6 GM/177ML PO SOLN: ORAL | Status: DC

## 2013-06-11 NOTE — Progress Notes (Signed)
Per pt, no allergies to soy or egg products.Pt is giving herself HCG injections  2 times a week(for weight loss ). No  O2 at home.

## 2013-06-25 ENCOUNTER — Ambulatory Visit (AMBULATORY_SURGERY_CENTER): Payer: Managed Care, Other (non HMO) | Admitting: Gastroenterology

## 2013-06-25 ENCOUNTER — Encounter: Payer: Self-pay | Admitting: Gastroenterology

## 2013-06-25 VITALS — BP 100/60 | HR 71 | Temp 97.7°F | Resp 16 | Ht 67.0 in | Wt 201.0 lb

## 2013-06-25 DIAGNOSIS — Z86010 Personal history of colonic polyps: Secondary | ICD-10-CM

## 2013-06-25 MED ORDER — SODIUM CHLORIDE 0.9 % IV SOLN: 500.0000 mL | INTRAVENOUS | Status: DC

## 2013-06-25 NOTE — Patient Instructions (Signed)

## 2013-06-25 NOTE — Progress Notes (Signed)
Report to PACU, RN, vss, BBS= Clear.  

## 2013-06-25 NOTE — Op Note (Signed)
[facility]  [address]. [place], [id number]   COLONOSCOPY PROCEDURE REPORT  PATIENT: Casey Chen, Casey Chen  MR#: [id number] BIRTHDATE: 1955/10/13 , 31  yrs. old GENDER: Female ENDOSCOPIST: Inda Castle, MD REFERRED BS:[facility] Ardeth Perfect, M.D. PROCEDURE DATE:  06/25/2013 PROCEDURE:   Colonoscopy, diagnostic First Screening Colonoscopy - Avg.  risk and is 50 yrs.  old or older - No.  Prior Negative Screening - Now for repeat screening. N/A  History of Adenoma - Now for follow-up colonoscopy & has Casey Chen > or = to 3 yrs.  Yes hx of adenoma.  Has Casey Chen 3 or more years since last colonoscopy.  Polyps Removed Today? No.  Recommend repeat exam, <10 yrs? Yes.  High risk (family or personal hx). ASA CLASS:   Class II INDICATIONS:Patient's personal history of colon polyps, multiple adenomatous polyps removed 2012 MEDICATIONS: MAC sedation, administered by CRNA and Propofol (Diprivan) 240 mg IV  DESCRIPTION OF PROCEDURE:   After the risks benefits and alternatives of the procedure were thoroughly explained, informed consent was obtained.  A digital rectal exam revealed no abnormalities of the rectum.   The [id number]  endoscope was introduced through the anus and advanced to the cecum, which was identified by both the appendix and ileocecal valve. No adverse events experienced.   The quality of the prep was Suprep good  The instrument was then slowly withdrawn as the colon was fully examined.      COLON FINDINGS: A normal appearing cecum, ileocecal valve, and appendiceal orifice were identified.  The ascending, hepatic flexure, transverse, splenic flexure, descending, sigmoid colon and rectum appeared unremarkable.  No polyps or cancers were seen. Retroflexed views revealed no abnormalities. The time to cecum=3 minutes 44 seconds.  Withdrawal time=7 minutes 22 seconds.  The scope was withdrawn and the procedure completed. COMPLICATIONS: There were no  complications.  ENDOSCOPIC IMPRESSION: Normal colon  RECOMMENDATIONS: Colonoscopy 7 years   eSigned:  Inda Castle, MD 06/25/2013 12:12 PM   cc:

## 2013-06-26 ENCOUNTER — Telehealth: Payer: Self-pay

## 2013-06-26 NOTE — Telephone Encounter (Signed)
  Follow up Call-  Call back number 06/25/2013  Post procedure Call Back phone  # [phone]  Permission to leave phone message Yes     Patient questions:  Do you have a fever, pain , or abdominal swelling? no Pain Score  0 *  Have you tolerated food without any problems? yes  Have you been able to return to your normal activities? yes  Do you have any questions about your discharge instructions: Diet   no Medications  no Follow up visit  no  Do you have questions or concerns about your Care? no  Actions: * If pain score is 4 or above: No action needed, pain <4.

## 2013-11-11 ENCOUNTER — Encounter: Payer: Self-pay | Admitting: Gastroenterology

## 2014-01-24 ENCOUNTER — Telehealth: Payer: Self-pay | Admitting: Nurse Practitioner

## 2014-01-24 NOTE — Telephone Encounter (Signed)
LMTCB about canceled appointment with PG

## 2014-03-24 ENCOUNTER — Ambulatory Visit: Payer: Managed Care, Other (non HMO) | Admitting: Nurse Practitioner

## 2014-04-15 ENCOUNTER — Encounter: Payer: Self-pay | Admitting: Nurse Practitioner

## 2014-04-15 ENCOUNTER — Ambulatory Visit (INDEPENDENT_AMBULATORY_CARE_PROVIDER_SITE_OTHER): Payer: Managed Care, Other (non HMO) | Admitting: Nurse Practitioner

## 2014-04-15 VITALS — BP 120/76 | HR 64 | Ht 67.0 in | Wt 188.0 lb

## 2014-04-15 DIAGNOSIS — Z0000 Encounter for general adult medical examination without abnormal findings: Secondary | ICD-10-CM

## 2014-04-15 DIAGNOSIS — R300 Dysuria: Secondary | ICD-10-CM | POA: Diagnosis not present

## 2014-04-15 DIAGNOSIS — Z01419 Encounter for gynecological examination (general) (routine) without abnormal findings: Secondary | ICD-10-CM

## 2014-04-15 LAB — POCT URINALYSIS DIPSTICK
Bilirubin, UA: NEGATIVE
Glucose, UA: NEGATIVE
KETONES UA: NEGATIVE
LEUKOCYTES UA: NEGATIVE
Nitrite, UA: NEGATIVE
PROTEIN UA: NEGATIVE
RBC UA: NEGATIVE
Urobilinogen, UA: NEGATIVE
pH, UA: 7

## 2014-04-15 NOTE — Patient Instructions (Signed)

## 2014-04-15 NOTE — Progress Notes (Signed)
Patient ID: Casey Chen, female   DOB: 1955/08/30, 59 y.o.   MRN: [id number] 58 y.o. [id number] Married  Caucasian Fe here for annual exam.  No new diagnosis this year.  Slight dysuria off on for a week.  Will get urine culture and follow.  Husband with history of kidney transplant in the 71's and is doing well.  Not SA.  Patient's last menstrual period was 01/11/2007.          Sexually active: Yes.    The current method of family planning is post menopausal status.    Exercising: Yes.    Gym/ health club routine includes water aerobics, spin class and weights 1 hour on Monday, 45 minutes on Tuesday and Wednesday. Smoker:  Former  Health Maintenance: Pap:  02/14/12, negative with neg HR HPV MMG:  2015, [facility] Colonoscopy:  06/15/13, normal, repeat in 7 years BMD:   Never  TDaP:  Not a candidate for TDaP, allergy to tetanus toxoids Labs:  HB:  PCP  Urine:  Trace leuk's   reports that she quit smoking about 24 years ago. Her smoking use included Cigarettes. She has a 13 pack-year smoking history. She has never used smokeless tobacco. She reports that she does not drink alcohol or use illicit drugs.  Past Medical History  Diagnosis Date  . GERD (gastroesophageal reflux disease)   . Head ache   . Depression   . Esophageal stricture 2011  . Hyperlipidemia   . Hypertension   . Stroke 2013    TIA on OCP, NO ESTROGEN  . Pinched nerve in shoulder     left  . Weakness     left arm past TIA    Past Surgical History  Procedure Laterality Date  . Umbilical hernia repair  1979  . Esophagogastroduodenoscopy    . Tubal ligation  1979    Current Outpatient Prescriptions  Medication Sig Dispense Refill  . aspirin 325 MG tablet Take 325 mg by mouth daily.    . CRESTOR 20 MG tablet Take 20 mg by mouth daily.  1  . diazepam (VALIUM) 5 MG tablet Take 1 tablet (5 mg total) by mouth 2 (two) times daily. 10 tablet 0  . FLUoxetine (PROZAC) 20 MG capsule Take 20 mg by mouth daily.    . Melatonin 2.5 MG  CAPS Take 2.5 mg by mouth at bedtime as needed (sleep).    Marland Kitchen omeprazole-sodium bicarbonate (ZEGERID) 40-1100 MG per capsule Take 1 capsule by mouth daily.    Marland Kitchen PRESCRIPTION MEDICATION Hcg Injection-2 times a week for weight loss    . traMADol (ULTRAM) 50 MG tablet Take 1 tablet by mouth every 12 (twelve) hours as needed for moderate pain.     . verapamil (CALAN-SR) 240 MG CR tablet Take 240 mg by mouth at bedtime.     No current facility-administered medications for this visit.    Family History  Problem Relation Age of Onset  . Colon polyps Mother     father  . Irritable bowel syndrome Mother   . Lung cancer Mother   . Multiple births Mother     one set of twins  . Depression Mother   . Brain cancer Brother   . Diabetes Sister   . Heart disease Father   . Colon polyps Father   . Colon cancer Neg Hx     ROS:  Pertinent items are noted in HPI.  Otherwise, a comprehensive ROS was negative.  Exam:   BP 120/76 mmHg  Pulse 64  Ht 5\' 7"  (1.702 m)  Wt 188 lb (85.276 kg)  BMI 29.44 kg/m2  LMP 01/11/2007 Height: 5\' 7"  (170.2 cm) Ht Readings from Last 3 Encounters:  04/15/14 5\' 7"  (1.702 m)  06/25/13 5\' 7"  (1.702 m)  06/11/13 5\' 7"  (1.702 m)    General appearance: alert, cooperative and appears stated age Head: Normocephalic, without obvious abnormality, atraumatic Neck: no adenopathy, supple, symmetrical, trachea midline and thyroid normal to inspection and palpation Lungs: clear to auscultation bilaterally Breasts: normal appearance, no masses or tenderness Heart: regular rate and rhythm Abdomen: soft, non-tender; no masses,  no organomegaly Extremities: extremities normal, atraumatic, no cyanosis or edema Skin: Skin color, texture, turgor normal. No rashes or lesions Lymph nodes: Cervical, supraclavicular, and axillary nodes normal. No abnormal inguinal nodes palpated Neurologic: Grossly normal   Pelvic: External genitalia:  no lesions              Urethra:  normal  appearing urethra with no masses, tenderness or lesions              Bartholin's and Skene's: normal                 Vagina: normal appearing vagina with normal color and discharge, no lesions              Cervix: anteverted              Pap taken: No. 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  Will get copy of Mammo from [facility] on breast self exam, mammography screening, adequate intake of calcium and vitamin D, diet and exercise, Kegel's exercises return annually or prn  An After Visit Summary was printed and given to the patient.

## 2014-04-16 LAB — URINALYSIS, MICROSCOPIC ONLY
Bacteria, UA: NONE SEEN
Casts: NONE SEEN
Crystals: NONE SEEN

## 2014-04-16 LAB — URINE CULTURE
Colony Count: NO GROWTH
Organism ID, Bacteria: NO GROWTH

## 2014-04-20 NOTE — Progress Notes (Signed)
Encounter reviewed by Dr. Brook Silva.  

## 2014-04-29 ENCOUNTER — Telehealth: Payer: Self-pay | Admitting: Nurse Practitioner

## 2014-04-29 NOTE — Telephone Encounter (Signed)
Provider cx. Left voicemail for pt to cb and rs. Recall entered.  °

## 2015-04-11 DIAGNOSIS — R7302 Impaired glucose tolerance (oral): Secondary | ICD-10-CM

## 2015-04-11 HISTORY — DX: Impaired glucose tolerance (oral): R73.02

## 2015-04-16 ENCOUNTER — Ambulatory Visit: Payer: Managed Care, Other (non HMO) | Admitting: Nurse Practitioner

## 2015-05-05 ENCOUNTER — Ambulatory Visit (INDEPENDENT_AMBULATORY_CARE_PROVIDER_SITE_OTHER): Payer: Managed Care, Other (non HMO) | Admitting: Nurse Practitioner

## 2015-05-05 ENCOUNTER — Encounter: Payer: Self-pay | Admitting: Nurse Practitioner

## 2015-05-05 VITALS — BP 128/80 | HR 84 | Resp 16 | Ht 67.0 in | Wt 218.0 lb

## 2015-05-05 DIAGNOSIS — N814 Uterovaginal prolapse, unspecified: Secondary | ICD-10-CM

## 2015-05-05 DIAGNOSIS — R7302 Impaired glucose tolerance (oral): Secondary | ICD-10-CM | POA: Diagnosis not present

## 2015-05-05 DIAGNOSIS — Z01419 Encounter for gynecological examination (general) (routine) without abnormal findings: Secondary | ICD-10-CM

## 2015-05-05 DIAGNOSIS — Z0000 Encounter for general adult medical examination without abnormal findings: Secondary | ICD-10-CM | POA: Diagnosis not present

## 2015-05-05 LAB — POCT URINALYSIS DIPSTICK
Bilirubin, UA: NEGATIVE
Blood, UA: NEGATIVE
GLUCOSE UA: NEGATIVE
Ketones, UA: NEGATIVE
Leukocytes, UA: NEGATIVE
Nitrite, UA: NEGATIVE
PH UA: 5
PROTEIN UA: NEGATIVE
Urobilinogen, UA: NEGATIVE

## 2015-05-05 LAB — HEPATITIS C ANTIBODY: HCV Ab: NEGATIVE

## 2015-05-05 NOTE — Patient Instructions (Signed)

## 2015-05-05 NOTE — Progress Notes (Signed)
60 y.o. [id number] Married  Caucasian Fe here for annual exam.  No new diagnosis this year.   OA of right hip and both knees.  Recent fall on left knee with swelling.  She complains about stress incontinence.  She has to wear Depends daily.  At times only loss of urine with cough or change of positions and then sometimes incontinent without any cause.   She denies any bowel changes or stool incontinence.  She is being tested for diabetes now and her HGB AIC was elevated at 6.5 - she is doing daily blood sugars and will be seeing PCP on 05/11/2015.  Patient's last menstrual period was 01/11/2007.          Sexually active: No.  The current method of family planning is post menopausal status.    Exercising: No.  The patient does not participate in regular exercise at present. Smoker:  no  Health Maintenance: Pap:  02/14/12 Neg. HR HPV:neg MMG:  07/25/14 BIRADS1: negative Colonoscopy:  06/25/13 = 3 polyps pre cancer, Repeat 7 years  BMD:   Never TDaP:  10/2014  Shingles: No Pneumonia: Done 10/2014  Hep C and HIV: today Labs: PCP  Urine: negative   reports that she quit smoking about 25 years ago. Her smoking use included Cigarettes. She has a 13 pack-year smoking history. She has never used smokeless tobacco. She reports that she drinks about 0.6 oz of alcohol per week. She reports that she does not use illicit drugs.  Past Medical History  Diagnosis Date  . GERD (gastroesophageal reflux disease)   . Head ache   . Depression   . Esophageal stricture 2011  . Hyperlipidemia   . Hypertension   . Stroke ([place]) 2013    TIA on OCP, NO ESTROGEN  . Pinched nerve in shoulder     left  . Weakness     left arm past TIA    Past Surgical History  Procedure Laterality Date  . Umbilical hernia repair  1979  . Esophagogastroduodenoscopy    . Tubal ligation  1979    Current Outpatient Prescriptions  Medication Sig Dispense Refill  . aspirin 325 MG tablet Take 325 mg by mouth daily.    Marland Kitchen FLUoxetine  (PROZAC) 20 MG capsule Take 40 mg by mouth daily.     . Melatonin 2.5 MG CAPS Take 2.5 mg by mouth at bedtime as needed (sleep).    Marland Kitchen omeprazole-sodium bicarbonate (ZEGERID) 40-1100 MG per capsule Take 1 capsule by mouth daily.    . rosuvastatin (CRESTOR) 40 MG tablet Take 1 tablet by mouth daily.    . traMADol (ULTRAM) 50 MG tablet Take 1 tablet by mouth every 12 (twelve) hours as needed for moderate pain.     . verapamil (CALAN-SR) 240 MG CR tablet Take 240 mg by mouth at bedtime.     No current facility-administered medications for this visit.    Family History  Problem Relation Age of Onset  . Colon polyps Mother     father  . Irritable bowel syndrome Mother   . Lung cancer Mother   . Multiple births Mother     one set of twins  . Depression Mother   . Brain cancer Brother   . Diabetes Sister   . Heart disease Father   . Colon polyps Father   . Colon cancer Neg Hx     ROS:  Pertinent items are noted in HPI.  Otherwise, a comprehensive ROS was negative.  Exam:  BP 128/80 mmHg  Pulse 84  Resp 16  Ht 5\' 7"  (1.702 m)  Wt 218 lb (98.884 kg)  BMI 34.14 kg/m2  LMP 01/11/2007 Height: 5\' 7"  (170.2 cm) Ht Readings from Last 3 Encounters:  05/05/15 5\' 7"  (1.702 m)  04/15/14 5\' 7"  (1.702 m)  06/25/13 5\' 7"  (1.702 m)    General appearance: alert, cooperative and appears stated age Head: Normocephalic, without obvious abnormality, atraumatic Neck: no adenopathy, supple, symmetrical, trachea midline and thyroid normal to inspection and palpation Lungs: clear to auscultation bilaterally Breasts: normal appearance, no masses or tenderness, positive findings: fibrocystic changes and tenderness always in both breast. Heart: regular rate and rhythm Abdomen: soft, non-tender; no masses,  no organomegaly Extremities: extremities normal, atraumatic, no cyanosis or edema Skin: Skin color, texture, turgor normal. No rashes or lesions Lymph nodes: Cervical, supraclavicular, and axillary  nodes normal. No abnormal inguinal nodes palpated Neurologic: Grossly normal   Pelvic: External genitalia:  no lesions              Urethra:  normal appearing urethra with no masses, tenderness or lesions              Bartholin's and Skene's: normal                 Vagina: normal appearing vagina with normal color and discharge, no lesions.  She has pelvic relaxation with Procidentia.  On standing with valsalva, there is cystocele to the introitus              Cervix: anteverted              Pap taken: Yes.   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 Quincy Simmonds for urinary incontinence and may need Uro dynamics, not sure Pessary is a good option as she could not use vaginal estrogen.  Counseled on breast self exam, mammography screening, adequate intake of calcium and vitamin D, diet and exercise, Kegel's exercises return annually or prn  An After Visit Summary was printed and given to the patient.

## 2015-05-06 ENCOUNTER — Encounter: Payer: Self-pay | Admitting: Obstetrics and Gynecology

## 2015-05-06 ENCOUNTER — Ambulatory Visit (INDEPENDENT_AMBULATORY_CARE_PROVIDER_SITE_OTHER): Payer: Managed Care, Other (non HMO) | Admitting: Obstetrics and Gynecology

## 2015-05-06 VITALS — BP 130/76 | HR 80 | Ht 67.0 in | Wt 219.6 lb

## 2015-05-06 DIAGNOSIS — N3946 Mixed incontinence: Secondary | ICD-10-CM

## 2015-05-06 DIAGNOSIS — N812 Incomplete uterovaginal prolapse: Secondary | ICD-10-CM

## 2015-05-06 LAB — HIV ANTIBODY (ROUTINE TESTING W REFLEX): HIV: NONREACTIVE

## 2015-05-06 NOTE — Progress Notes (Signed)
Patient ID: Casey Chen, female   DOB: 27-Mar-1955, 60 y.o.   MRN: [id number] GYNECOLOGY  VISIT   HPI: 60 y.o.   Married  Caucasian  female   [phone] with Patient's last menstrual period was 01/11/2007.   here for stress urinary incontinence.  Patient states she has to wear Depends.   Has fullness in the vagina about 9 months. Wearing Depends for urinary incontinence also for the same amount of time.  Can leak for no reason.  Also leaking with cough, laugh, and sneeze.  No problems with bladder infections.  Has had enuresis.   Notes constipation.  Uses a stool softener as needed.  Does perirectal splinting to have bowel movements.  Leaks stool every now and then.  Once every 3 - 4 months.   Saw a urologist years ago to discuss bladder support problems.  No prior urodynamic testing was done.  No prior pelvic surgery.   Leakage is affecting the quality of her life.   2 vaginal deliveries.  Largest 7 # 5 ounces.  Had a third degree laceration.   GYNECOLOGIC HISTORY: Patient's last menstrual period was 01/11/2007. Contraception:  Postmenopausal Menopausal hormone therapy:  None Last mammogram:  07-25-14 3D/Density B/Neg/BiRads1:[facility] Last pap smear:   05-05-15 pending; 03-05-12 Neg:Neg HR HPV       OB History    Gravida Para Term Preterm AB TAB SAB Ectopic Multiple Living   3 2 2  1  1   2          Patient Active Problem List   Diagnosis Date Noted  . 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 Depression 03/19/2013  . Other and unspecified hyperlipidemia 03/19/2013  . Postmenopausal 03/19/2013  . TIA (transient ischemic attack) 03/19/2013  . HTN (hypertension) 11/02/2012  . Numbness 09/20/2012  . Benign neoplasm of colon 06/08/2010  . Esophageal reflux 04/19/2010  . Dysphagia, unspecified(787.20) 04/19/2010    Past Medical History  Diagnosis Date  . GERD (gastroesophageal reflux disease)   . Head ache   . Depression   . Esophageal stricture 2011   . Hyperlipidemia   . Hypertension   . Stroke ([place]) 2013    TIA on OCP, NO ESTROGEN.  recurrent TIA 08/2012  . Pinched nerve in shoulder     left  . Weakness     left arm past TIA  . Glucose intolerance (impaired glucose tolerance) 04/2015    Past Surgical History  Procedure Laterality Date  . Umbilical hernia repair  1979  . Esophagogastroduodenoscopy    . Tubal ligation  1979    Current Outpatient Prescriptions  Medication Sig Dispense Refill  . aspirin 325 MG tablet Take 325 mg by mouth daily.    Marland Kitchen FLUoxetine (PROZAC) 20 MG capsule Take 40 mg by mouth daily.     . Melatonin 2.5 MG CAPS Take 2.5 mg by mouth at bedtime as needed (sleep).    Marland Kitchen omeprazole-sodium bicarbonate (ZEGERID) 40-1100 MG per capsule Take 1 capsule by mouth daily.    . rosuvastatin (CRESTOR) 40 MG tablet Take 1 tablet by mouth daily.    . traMADol (ULTRAM) 50 MG tablet Take 1 tablet by mouth every 12 (twelve) hours as needed for moderate pain.     . verapamil (CALAN-SR) 240 MG CR tablet Take 240 mg by mouth at bedtime.     No current facility-administered medications for this visit.     ALLERGIES: Tetanus toxoids  Family History  Problem Relation Age of Onset  .  Colon polyps Mother     father  . Irritable bowel syndrome Mother   . Lung cancer Mother   . Multiple births Mother     one set of twins  . Depression Mother   . Brain cancer Brother   . Diabetes Sister   . Heart disease Father   . Colon polyps Father   . Colon cancer Neg Hx     Social History   Social History  . Marital Status: Married    Spouse Name: N/A  . Number of Children: 2  . Years of Education: N/A   Occupational History  . bookkeeper    Social History Main Topics  . Smoking status: Former Smoker -- 1.00 packs/day for 13 years    Types: Cigarettes    Quit date: 01/10/1990  . Smokeless tobacco: Never Used  . Alcohol Use: 0.6 oz/week    1 Glasses of wine per week  . Drug Use: No  . Sexual Activity: Not Currently     Birth Control/ Protection: Post-menopausal   Other Topics Concern  . Not on file   Social History Narrative    ROS:  Pertinent items are noted in HPI.  PHYSICAL EXAMINATION:    BP 130/76 mmHg  Pulse 80  Ht 5\' 7"  (1.702 m)  Wt 219 lb 9.6 oz (99.61 kg)  BMI 34.39 kg/m2  LMP 01/11/2007    General appearance: alert, cooperative and appears stated age  Pelvic: External genitalia:  no lesions              Urethra:  normal appearing urethra with no masses, tenderness or lesions              Bartholins and Skenes: normal                 Vagina: normal appearing vagina with normal color and discharge, no lesions.  Second degree cystocele, and first degree rectocele.  Mild uterine prolapse.              Cervix: no lesions            Bimanual Exam:  Uterus:  normal size, contour, position, consistency, mobility, non-tender              Adnexa: normal adnexa and no mass, fullness, tenderness              Rectal exam: Yes.  .  Confirms.              Anus:  slightly decreased sphincter tone, no lesions  Chaperone was present for exam.  ASSESSMENT  Mixed incontinence.  Constipation.  Incomplete uterovaginal prolapse. Hx recurrent TIA on OCPs.  PLAN  Discussion of prolapse and mixed incontinence.  Anatomy, etiologies, and treatments discussed.  Both medical options with pessary/Impressa and PT and surgical care discussed. Patient is most interested in surgical care and will proceed with urodynamic testing and surgical planning.  PT can be added at any time to compliment her care.  ACOG handouts on prolapse and incontinence and surgical care thereof to patient.   An After Visit Summary was printed and given to the patient.  __25____ minutes face to face time of which over 50% was spent in counseling.

## 2015-05-08 LAB — IPS PAP TEST WITH HPV

## 2015-05-08 NOTE — Progress Notes (Signed)
Encounter reviewed by Dr. Brook Amundson C. Silva.  

## 2015-05-11 ENCOUNTER — Telehealth: Payer: Self-pay | Admitting: *Deleted

## 2015-05-11 NOTE — Telephone Encounter (Signed)
Call to patient. Review of urodynamic procedure and instruction sheet mailed. Pre-procedure u/a scheduled for 05-22-15.   Routing to provider for final review. Patient agreeable to disposition. Will close encounter.

## 2015-05-20 ENCOUNTER — Telehealth: Payer: Self-pay | Admitting: Emergency Medicine

## 2015-05-20 NOTE — Telephone Encounter (Signed)
Call to patient to reschedule urodynamics appointment due to closure of procedure room on 05/27/15. Need to reschedule UA that is scheduled for Friday 5/12 to Friday 5/19 and Urodynamics to 06/03/15.   Message left to return call to [place] at [phone].

## 2015-05-21 NOTE — Telephone Encounter (Signed)
Return call to Tracy. °

## 2015-05-21 NOTE — Telephone Encounter (Signed)
Appointments moved and patient agreeable. She has been given instructions prior.  Will call back with any concerns. Routing to provider for final review. Patient agreeable to disposition. Will close encounter.

## 2015-05-22 ENCOUNTER — Ambulatory Visit: Payer: Managed Care, Other (non HMO)

## 2015-05-27 ENCOUNTER — Ambulatory Visit: Payer: Managed Care, Other (non HMO)

## 2015-05-29 ENCOUNTER — Ambulatory Visit: Payer: Managed Care, Other (non HMO)

## 2015-05-29 ENCOUNTER — Ambulatory Visit (INDEPENDENT_AMBULATORY_CARE_PROVIDER_SITE_OTHER): Payer: Managed Care, Other (non HMO) | Admitting: *Deleted

## 2015-05-29 DIAGNOSIS — Z01812 Encounter for preprocedural laboratory examination: Secondary | ICD-10-CM | POA: Diagnosis not present

## 2015-05-29 LAB — POCT URINALYSIS DIPSTICK
Bilirubin, UA: NEGATIVE
GLUCOSE UA: NEGATIVE
Ketones, UA: NEGATIVE
Leukocytes, UA: NEGATIVE
Nitrite, UA: NEGATIVE
PROTEIN UA: NEGATIVE
RBC UA: NEGATIVE
Urobilinogen, UA: NEGATIVE
pH, UA: 6

## 2015-05-29 NOTE — Progress Notes (Signed)
Patient ID: Casey Chen, female   DOB: [birth year], 60 y.o.   MRN: [id number]  Patient presented for urinalysis prior to Urodynamics.  Patient denies any UTI symptoms. Urinalysis negative.

## 2015-05-31 NOTE — Progress Notes (Signed)
Encounter reviewed by Dr. Brook Amundson C. Silva.  

## 2015-06-01 ENCOUNTER — Ambulatory Visit: Payer: Managed Care, Other (non HMO) | Admitting: Obstetrics and Gynecology

## 2015-06-03 ENCOUNTER — Ambulatory Visit (INDEPENDENT_AMBULATORY_CARE_PROVIDER_SITE_OTHER): Payer: Managed Care, Other (non HMO) | Admitting: Obstetrics and Gynecology

## 2015-06-03 DIAGNOSIS — N3946 Mixed incontinence: Secondary | ICD-10-CM | POA: Diagnosis not present

## 2015-06-03 MED ORDER — CIPROFLOXACIN HCL 250 MG PO TABS: 250.0000 mg | ORAL_TABLET | Freq: Two times a day (BID) | ORAL | Status: DC

## 2015-06-03 NOTE — Progress Notes (Signed)
Casey Chen is a 60 y.o. female who presents today for urodynamics testings. Allergies and medications reviewed. Informed consent obtained. Denies complaints today. No urinary complaints.  Urine Micro exam: negative for WBC's or RBC's, okay to proceed per Dr. Quincy Simmonds. Patient reports urinary leakage with coughing, sneezing, exercise, and heavy lifting. She also reports she can leak spontaneously and have difficulty ambulating to make it to restroom. Also reports that she doesn't leak with every cough or sneeze.    Spontaneous leaking noted as patient positioning on exam table.  Post void residual approximately 100 cc.  Urodynamics testing initiated.    Urinary and rectal catheter placed without issue.  Once leaking occurred with valsalva, patient would be unable to stop urine flow. Due to amount of spontaneous leaking, did not proceed to 3 rd sensation. Multiple attempts needed to obtain UCP result. Reviewed with Dr Quincy Simmonds and Cipro sent to pharmacy x 2 doses.  Please see scanned patient summary report in Epic. Procedure completed and patient tolerated well without complaints.    Patient given post procedure instructions:  You may have a mild bladder and rectal discomfort for a few hours after the test. You may experience some frequent urination and slight burning the first few times you urinate after the test. Rarely, the urine may be blood tinged. These are both due to catheter placements and resolve quickly. You should call our office immediately if you have signs of infection, which may include bladder pain, urinary urgency, fever, or burning during urination. We do encourage you to drink plenty of water after the test. Cipro as directed.

## 2015-06-10 ENCOUNTER — Ambulatory Visit (INDEPENDENT_AMBULATORY_CARE_PROVIDER_SITE_OTHER): Payer: Managed Care, Other (non HMO) | Admitting: Obstetrics and Gynecology

## 2015-06-10 ENCOUNTER — Encounter: Payer: Self-pay | Admitting: Obstetrics and Gynecology

## 2015-06-10 VITALS — BP 124/80 | HR 74 | Resp 16 | Ht 67.0 in | Wt 219.0 lb

## 2015-06-10 DIAGNOSIS — N3946 Mixed incontinence: Secondary | ICD-10-CM

## 2015-06-10 DIAGNOSIS — N812 Incomplete uterovaginal prolapse: Secondary | ICD-10-CM

## 2015-06-10 MED ORDER — SOLIFENACIN SUCCINATE 5 MG PO TABS: 5.0000 mg | ORAL_TABLET | Freq: Every day | ORAL | Status: DC

## 2015-06-10 NOTE — Patient Instructions (Signed)
Solifenacin tablets What is this medicine? SOLIFENACIN (sol i FEN a cin) is used to treat overactive bladder. This medicine reduces the amount of bathroom visits. It may also help to control wetting accidents. This medicine may be used for other purposes; ask your health care provider or pharmacist if you have questions. What should I tell my health care provider before I take this medicine? They need to know if you have any of these conditions: -difficulty passing urine -glaucoma -intestinal obstruction -kidney disease -liver disease -an unusual or allergic reaction to solifenacin, other medicines, foods, dyes, or preservatives -pregnant or trying to get pregnant -breast-feeding How should I use this medicine? Take this medicine by mouth with a glass of water. Follow the directions on the prescription label. This medicine can be taken with or without food. Take your doses at regular intervals. Do not take your medicine more often than directed. Do not stop taking except on the advice of your doctor or health care professional. Talk to your pediatrician regarding the use of this medicine in children. Special care may be needed. Overdosage: If you think you have taken too much of this medicine contact a poison control center or emergency room at once. NOTE: This medicine is only for you. Do not share this medicine with others. What if I miss a dose? If you miss a dose, take it as soon as you can. If it is almost time for your next dose, take only that dose. Do not take double or extra doses. What may interact with this medicine? Do not take this medicine with any of the following medications: -certain medicines for fungal infections like fluconazole, itraconazole, ketoconazole, posaconazole, voriconazole -cisapride -dofetilide -dronedarone -pimozide -thioridazine -ziprasidone This medicine may also interact with the following medications: -atropine -other medicines that prolong the QT  interval (cause an abnormal heart rhythm) -oxybutynin -scopolamine This list may not describe all possible interactions. Give your health care provider a list of all the medicines, herbs, non-prescription drugs, or dietary supplements you use. Also tell them if you smoke, drink alcohol, or use illegal drugs. Some items may interact with your medicine. What should I watch for while using this medicine? It may take 2 or 3 months to notice the full benefit from this medicine. You may need to limit your intake tea, coffee, caffeinated sodas, and alcohol. These drinks may make your symptoms worse. You may get drowsy or dizzy. Do not drive, use machinery, or do anything that needs mental alertness until you know how this medicine affects you. Do not stand or sit up quickly, especially if you are an older patient. This reduces the risk of dizzy or fainting spells. Alcohol may interfere with the effect of this medicine. Avoid alcoholic drinks. Your mouth may get dry. Chewing sugarless gum or sucking hard candy, and drinking plenty of water may help. Contact your doctor if the problem does not go away or is severe. This medicine may cause dry eyes and blurred vision. If you wear contact lenses, you may feel some discomfort. Lubricating drops may help. See your eye care professional if the problem does not go away or is severe. Avoid extreme heat. This medicine can cause you to sweat less than normal. Your body temperature could increase to dangerous levels, which may lead to heat stroke. What side effects may I notice from receiving this medicine? Side effects that you should report to your doctor or health care professional as soon as possible: -allergic reactions like skin rash, itching  or hives, swelling of the face, lips, or tongue -blurred vision or difficulty focusing vision -breathing problems -chest pain or palpitations -confusion -fast, irregular heartbeat -feeling faint or lightheaded,  falls -hallucinations -severe dizziness -trouble passing urine or change in the amount of urine -unusually weak or tired Side effects that usually do not require medical attention (report to your doctor or health care professional if they continue or are bothersome): -constipation -drowsiness -headache -indigestion or stomach upset -nausea This list may not describe all possible side effects. Call your doctor for medical advice about side effects. You may report side effects to FDA at 1-800-FDA-1088. Where should I keep my medicine? Keep out of the reach of children. Store at room temperature between 15 and 30 degrees C (59 and 86 degrees F). Throw away any unused medicine after the expiration date. NOTE: This sheet is a summary. It may not cover all possible information. If you have questions about this medicine, talk to your doctor, pharmacist, or health care provider.    2016, [employer]vier/Gold Standard. (2012-07-24 12:42:09)

## 2015-06-10 NOTE — Progress Notes (Signed)
Urodynamic testing on 06/03/15 -  Uroflow - Void 44 cc and PVR 100 cc. Continuous.  CMG - S1 - 7 cc. S3 - 106 cc. Multiple detrusor contractions noted. VLPP 89 cm H2O at 8 cc. UPP - 25 cm H2O at 106 cc.  Pressure flow - P Det max looks like it is about 75 cm H2O. Voided 220 cc.  Evidence of significant detrusor instability.  Stress incontinence noted but at a very low infused volume. Small bladder capacity.

## 2015-06-10 NOTE — Progress Notes (Signed)
GYNECOLOGY  VISIT   HPI: 60 y.o.   Married  Caucasian  female   [phone] with Patient's last menstrual period was 01/11/2007.   here for urodynamics results. Considering surgery.   Has significant urinary incontinence and wears Depends.  Leaks with cough, laugh, and sneeze, and for no reason.  Does perirectal splinting about every 5th time she has a bowel movement.  Drinks 2 -3, 20 ounce Pepsi per day.  Occasional tea.  No coffee.  Pelvic exam on 05/06/15 - Second degree cystocele, and first degree rectocele. Mild uterine prolapse.  No hx of glaucoma or cardiac arrhythmia. Hx diabetes and last HgbA1C 6.2.  Urodynamic testing on 06/03/15 -  Uroflow - Void 44 cc and PVR 100 cc.  Continuous.  CMG - S1 - 7 cc.  S3 - 106 cc.  Multiple detrusor contractions noted.  VLPP 89 cm H2O at 8 cc. UPP - 25 cm H2O at 106 cc.  Pressure flow - P Det max looks like it is about 75 cm H2O.  Voided 220 cc.  GYNECOLOGIC HISTORY: Patient's last menstrual period was 01/11/2007. Contraception:  btl Menopausal hormone therapy: none Last mammogram: 07-25-14 category b density birads 1:neg Last pap smear:  05-05-15 neg HPV HR neg        OB History    Gravida Para Term Preterm AB TAB SAB Ectopic Multiple Living   3 2 2  1  1   2          Patient Active Problem List   Diagnosis Date Noted  . 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 Depression 03/19/2013  . Other and unspecified hyperlipidemia 03/19/2013  . Postmenopausal 03/19/2013  . TIA (transient ischemic attack) 03/19/2013  . HTN (hypertension) 11/02/2012  . Numbness 09/20/2012  . Benign neoplasm of colon 06/08/2010  . Esophageal reflux 04/19/2010  . Dysphagia, unspecified(787.20) 04/19/2010    Past Medical History  Diagnosis Date  . GERD (gastroesophageal reflux disease)   . Head ache   . Depression   . Esophageal stricture 2011  . Hyperlipidemia   . Hypertension   . Stroke ([place]) 2013    TIA on OCP, NO ESTROGEN.   recurrent TIA 08/2012  . Pinched nerve in shoulder     left  . Weakness     left arm past TIA  . Glucose intolerance (impaired glucose tolerance) 04/2015    Past Surgical History  Procedure Laterality Date  . Umbilical hernia repair  1979  . Esophagogastroduodenoscopy    . Tubal ligation  1979    Current Outpatient Prescriptions  Medication Sig Dispense Refill  . aspirin 325 MG tablet Take 325 mg by mouth daily.    Marland Kitchen FLUoxetine (PROZAC) 20 MG capsule Take 40 mg by mouth daily.     . Melatonin 2.5 MG CAPS Take 2.5 mg by mouth at bedtime as needed (sleep).    Marland Kitchen omeprazole-sodium bicarbonate (ZEGERID) 40-1100 MG per capsule Take 1 capsule by mouth daily.    . rosuvastatin (CRESTOR) 40 MG tablet Take 1 tablet by mouth daily.    . traMADol (ULTRAM) 50 MG tablet Take 1 tablet by mouth every 12 (twelve) hours as needed for moderate pain.     . verapamil (CALAN-SR) 240 MG CR tablet Take 240 mg by mouth at bedtime.     No current facility-administered medications for this visit.     ALLERGIES: Review of patient's allergies indicates no active allergies.  Family History  Problem Relation Age of Onset  .  Colon polyps Mother     father  . Irritable bowel syndrome Mother   . Lung cancer Mother   . Multiple births Mother     one set of twins  . Depression Mother   . Brain cancer Brother   . Diabetes Sister   . Heart disease Father   . Colon polyps Father   . Colon cancer Neg Hx     Social History   Social History  . Marital Status: Married    Spouse Name: N/A  . Number of Children: 2  . Years of Education: N/A   Occupational History  . bookkeeper    Social History Main Topics  . Smoking status: Former Smoker -- 1.00 packs/day for 13 years    Types: Cigarettes    Quit date: 01/10/1990  . Smokeless tobacco: Never Used  . Alcohol Use: 0.6 oz/week    1 Glasses of wine per week  . Drug Use: No  . Sexual Activity: No     Comment: btl   Other Topics Concern  . Not on  file   Social History Narrative    ROS:  Pertinent items are noted in HPI.  PHYSICAL EXAMINATION:    BP 124/80 mmHg  Pulse 74  Resp 16  Ht 5\' 7"  (1.702 m)  Wt 219 lb (99.338 kg)  BMI 34.29 kg/m2  LMP 01/11/2007    General appearance: alert, cooperative and appears stated age    Pelvic: External genitalia:  no lesions              Urethra:  normal appearing urethra with no masses, tenderness or lesions              Bartholins and Skenes: normal                 Vagina: normal appearing vagina with normal color and discharge, no lesions.  Second degree cystocele and first degree uterine prolapse and first degree rectocele.              Cervix: no lesions               Bimanual Exam:  Uterus:  normal size, contour, position, consistency, mobility, non-tender              Adnexa: normal adnexa and no mass, fullness, tenderness             Chaperone was present for exam.  ASSESSMENT  Mixed incontinence.  Significant detrusor instability.  Constipation with splinting.  Incomplete uterovaginal prolapse. Hx recurrent TIA on OCPs.  PLAN  Discussion of prolapse and incontinence.   Recommend discontinuation of bladder irritants.   May need to wean off caffeine to reduce change of headache.  Start Vesicare 5 mg daily.  Instructed in use.  Side effects reviewed.  Discussed findings of stress incontinence and options for care - PT, pessary, surgery which would include a laparoscopically assisted vaginal hysterectomy with bilateral salpingo-oophorectomy, anterior and posterior colporrhaphy, TV Exact midurethral sling and cystoscopy.    Risks of surgery include but are not limited tobleeding, infection, damage to surrounding organs, ureteral damage, permanent mesh use which may cause erosion and exposure in the vagina, urethra, bladder or ureters, urinary retention, possible need for prolonged catheterization and/or self catheterization, de novo overactive bladder symptoms, reoperation,  recurrence of prolapse and incontinence,  DVT, PE, death, and reaction to anesthesia.    I have discussed surgical expectations regarding the procedures and success rates, outcomes, and recovery.  Patient will consider her options  Follow up in 6 weeks planned.   An After Visit Summary was printed and given to the patient.  ___40___ minutes face to face time of which over 50% was spent in counseling.

## 2015-06-13 ENCOUNTER — Encounter: Payer: Self-pay | Admitting: Obstetrics and Gynecology

## 2015-06-15 ENCOUNTER — Telehealth: Payer: Self-pay | Admitting: Obstetrics and Gynecology

## 2015-06-15 NOTE — Telephone Encounter (Signed)
Spoke with patient. Patient states that she was advised by her pharmacy that she will need a PA for her Veiscare prescription or an alternative medication. Advised I will complete PA for Vesicare and will notify her once we have heard about an approval or denial from her insurance company. She is agreeable.

## 2015-06-15 NOTE — Telephone Encounter (Signed)
Patient called and said, "My pharmacy needs to speak with your office about [place]."  Pharmacy on file is correct.

## 2015-06-18 MED ORDER — OXYBUTYNIN CHLORIDE ER 10 MG PO TB24: 10.0000 mg | ORAL_TABLET | Freq: Every day | ORAL | Status: DC

## 2015-06-18 NOTE — Telephone Encounter (Signed)
PA for Laurin Coder has been denied by the patient's insurance company key code [id number]. I have notified the pharmacy that PA has been denied. Insurance would like the patient to try formulary alternatives first. These include generic trospium, trospium ER, tolterodine, tolterodine ER, oxybutynin, and oxybutynin XL. Routing to [place] for further review and advise.

## 2015-06-18 NOTE — Telephone Encounter (Signed)
Ok for Ditropan XL 10 mg daily.  #30, RF 2.

## 2015-06-18 NOTE — Telephone Encounter (Signed)
Spoke with patient. Advised of PA denial for Vesicare. Advised of recommendations as seen below from [place]. She is agreeable. Rx for Ditropan XL 10 mg daily #30 2RF sent to pharmacy on file. Patient is agreeable.  Routing to provider for final review. Patient agreeable to disposition. Will close encounter.

## 2015-06-18 NOTE — Telephone Encounter (Signed)
Please call [phone] to get prior authorization

## 2015-06-18 NOTE — Telephone Encounter (Signed)
[facility] is calling to get prior authorization for Veiscare.

## 2015-07-10 ENCOUNTER — Encounter: Payer: Self-pay | Admitting: Obstetrics and Gynecology

## 2015-07-24 ENCOUNTER — Ambulatory Visit (INDEPENDENT_AMBULATORY_CARE_PROVIDER_SITE_OTHER): Payer: Managed Care, Other (non HMO) | Admitting: Obstetrics and Gynecology

## 2015-07-24 ENCOUNTER — Telehealth: Payer: Self-pay | Admitting: Obstetrics and Gynecology

## 2015-07-24 ENCOUNTER — Encounter: Payer: Self-pay | Admitting: Obstetrics and Gynecology

## 2015-07-24 VITALS — BP 136/79 | HR 92 | Resp 16 | Ht 67.0 in | Wt 219.0 lb

## 2015-07-24 DIAGNOSIS — N3946 Mixed incontinence: Secondary | ICD-10-CM

## 2015-07-24 DIAGNOSIS — R102 Pelvic and perineal pain: Secondary | ICD-10-CM

## 2015-07-24 DIAGNOSIS — N812 Incomplete uterovaginal prolapse: Secondary | ICD-10-CM | POA: Diagnosis not present

## 2015-07-24 DIAGNOSIS — N9489 Other specified conditions associated with female genital organs and menstrual cycle: Secondary | ICD-10-CM

## 2015-07-24 MED ORDER — OXYBUTYNIN CHLORIDE ER 10 MG PO TB24: 10.0000 mg | ORAL_TABLET | Freq: Every day | ORAL | Status: DC

## 2015-07-24 NOTE — Telephone Encounter (Signed)
Patient was prescribed Oxybutynin 10 mg daily at bedtime #30 5RF on 07/24/2015. Rx changed to Oxybutynin 10 mg daily at bedtime #90 1R. This was sent electronically to her pharmacy on file.  Routing to provider for final review. Patient agreeable to disposition. Will close encounter.

## 2015-07-24 NOTE — Telephone Encounter (Signed)
Patient's pharmacy called and requested a 90 day supply of oxybutynin XL 10 mg for this patient. They said to just call or e-scribe if appropriate.

## 2015-07-24 NOTE — Progress Notes (Signed)
GYNECOLOGY  VISIT   HPI: 60 y.o.   Married  Caucasian  female   [phone] with Patient's last menstrual period was 01/11/2007.   here for  Mixed Incontinence follow up    History is as follows.   At visit from 06/10/15 the following history was gleaned. Has significant urinary incontinence and wears Depends.  Leaks with cough, laugh, and sneeze, and for no reason.  Does perirectal splinting about every 5th time she has a bowel movement.  Drinks 2 -3, 20 ounce Pepsi per day.  Occasional tea.  No coffee.  Pelvic exam on 05/06/15 - Second degree cystocele, and first degree rectocele. Mild uterine prolapse.  No hx of glaucoma or cardiac arrhythmia. Hx diabetes and last HgbA1C 6.2.  Urodynamic testing on 06/03/15 -  Uroflow - Void 44 cc and PVR 100 cc. Continuous.  CMG - S1 - 7 cc. S3 - 106 cc. Multiple detrusor contractions noted. VLPP 89 cm H2O at 8 cc. UPP - 25 cm H2O at 106 cc.  Pressure flow - P Det max looks like it is about 75 cm H2O. Voided 220 cc.   Started Ditropan XL 10 mg after her last visit on 06/10/15. Has been taking for 30 days.  Is leaking less now.  Still leaking with sneezing, coughing, bending over.  Now rarely is drinking diet sodas or other sources of caffeine.   Having dry mouth.  BMs are more loose than constipation.   Can feel pelvic pressure with standing.   Patient is now considering surgery.   GYNECOLOGIC HISTORY: Patient's last menstrual period was 01/11/2007. Contraception:  BTL Menopausal hormone therapy:  None Last mammogram:  07/25/14 BIRADS1:neg Last pap smear:   05/05/15 Neg. HR HPV:neg        OB History    Gravida Para Term Preterm AB TAB SAB Ectopic Multiple Living   3 2 2  1  1   2          Patient Active Problem List   Diagnosis Date Noted  . 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 Depression 03/19/2013  . Other and unspecified hyperlipidemia 03/19/2013  . Postmenopausal 03/19/2013  . TIA  (transient ischemic attack) 03/19/2013  . HTN (hypertension) 11/02/2012  . Numbness 09/20/2012  . Benign neoplasm of colon 06/08/2010  . Esophageal reflux 04/19/2010  . Dysphagia, unspecified(787.20) 04/19/2010    Past Medical History  Diagnosis Date  . GERD (gastroesophageal reflux disease)   . Head ache   . Depression   . Esophageal stricture 2011  . Hyperlipidemia   . Hypertension   . Stroke ([place]) 2013    TIA on OCP, NO ESTROGEN.  recurrent TIA 08/2012  . Pinched nerve in shoulder     left  . Weakness     left arm past TIA  . Glucose intolerance (impaired glucose tolerance) 04/2015    Past Surgical History  Procedure Laterality Date  . Umbilical hernia repair  1979  . Esophagogastroduodenoscopy    . Tubal ligation  1979    Current Outpatient Prescriptions  Medication Sig Dispense Refill  . aspirin 325 MG tablet Take 325 mg by mouth daily.    Marland Kitchen FLUoxetine (PROZAC) 20 MG capsule Take 40 mg by mouth daily.     . Melatonin 2.5 MG CAPS Take 2.5 mg by mouth at bedtime as needed (sleep).    Marland Kitchen omeprazole-sodium bicarbonate (ZEGERID) 40-1100 MG per capsule Take 1 capsule by mouth daily.    Marland Kitchen oxybutynin (DITROPAN XL) 10  MG 24 hr tablet Take 1 tablet (10 mg total) by mouth at bedtime. 30 tablet 2  . rosuvastatin (CRESTOR) 40 MG tablet Take 1 tablet by mouth daily.    . traMADol (ULTRAM) 50 MG tablet Take 1 tablet by mouth every 12 (twelve) hours as needed for moderate pain.     . verapamil (CALAN-SR) 240 MG CR tablet Take 240 mg by mouth at bedtime.     No current facility-administered medications for this visit.     ALLERGIES: Review of patient's allergies indicates no known allergies.  Family History  Problem Relation Age of Onset  . Colon polyps Mother     father  . Irritable bowel syndrome Mother   . Lung cancer Mother   . Multiple births Mother     one set of twins  . Depression Mother   . Brain cancer Brother   . Diabetes Sister   . Heart disease Father   .  Colon polyps Father   . Colon cancer Neg Hx     Social History   Social History  . Marital Status: Married    Spouse Name: N/A  . Number of Children: 2  . Years of Education: N/A   Occupational History  . bookkeeper    Social History Main Topics  . Smoking status: Former Smoker -- 1.00 packs/day for 13 years    Types: Cigarettes    Quit date: 01/10/1990  . Smokeless tobacco: Never Used  . Alcohol Use: 0.6 oz/week    1 Glasses of wine per week  . Drug Use: No  . Sexual Activity: No     Comment: btl   Other Topics Concern  . Not on file   Social History Narrative    ROS:  Pertinent items are noted in HPI.  PHYSICAL EXAMINATION:    BP 136/79 mmHg  Pulse 92  Resp 16  Ht 5\' 7"  (1.702 m)  Wt 219 lb (99.338 kg)  BMI 34.29 kg/m2  LMP 01/11/2007    General appearance: alert, cooperative and appears stated age     ASSESSMENT  Mixed incontinence.  On Ditropan XL 10 mg daily and having good response to this and to decreasing bladder irritants. Incomplete uterovaginal prolapse.   Pelvic pressure.  PLAN  Continue Ditropan XL 10 mg daily for overactive bladder.  Surgical care including LAVH/BSO/anterior and posterior colporrhaphy, TVT Exact midurethral sling and cystoscopy discussed.  Risks/benefits, and alternatives reviewed.  We discussed benefits and risks of surgery which include but are not limited to bleeding, infection, damage to surrounding organs, ureteral damage, vaginal pain with intercourse, permanent mesh use which may cause erosion and exposure in the vagina, urethra, bladder or ureters, dyspareunia, slower voiding and urinary retention, possible need for prolonged catheterization, increased overactive bladder symptoms, reoperation, recurrence of prolapse and incontinence,  DVT, PE, death, and reaction to anesthesia.   I have discussed surgical expectations regarding the procedures and success rates, outcomes, and recovery.    Patient will return for pelvic  ultrasound.   An After Visit Summary was printed and given to the patient.  ___15___ minutes face to face time of which over 50% was spent in counseling.

## 2015-07-28 ENCOUNTER — Telehealth: Payer: Self-pay | Admitting: Obstetrics and Gynecology

## 2015-07-28 NOTE — Telephone Encounter (Signed)
Spoke with pt regarding benefit for ultrasound. Patient understood and agreeable. Patient ready to schedule. Patient scheduled 08/13/15 with Dr Quincy Simmonds. Patient declined an earlier appointment, due to her schedule.   Routing to Dr Quincy Simmonds for final review

## 2015-07-29 NOTE — Telephone Encounter (Signed)
This appointment date for the pelvic ultrasound is fine.  Please continue to work in precert for surgical care.

## 2015-08-11 ENCOUNTER — Telehealth: Payer: Self-pay | Admitting: *Deleted

## 2015-08-11 NOTE — Telephone Encounter (Signed)
Call to patient to discuss surgery date options. Patient will talk with family about August versus September and call back in the morning.

## 2015-08-12 NOTE — Telephone Encounter (Signed)
Return call to patient. Desires to schedule surgery in September. Surgery scheduled for 09-29-15 at 0730 at [facility]. Surgery instruction sheet reviewed and printed copy mailed to patient., see copy scanned to chart.   Routing to provider for final review. Patient agreeable to disposition. Will close encounter.

## 2015-08-12 NOTE — Telephone Encounter (Signed)
Returning a call to Sally. °

## 2015-08-13 ENCOUNTER — Encounter: Payer: Self-pay | Admitting: Obstetrics and Gynecology

## 2015-08-13 ENCOUNTER — Ambulatory Visit (INDEPENDENT_AMBULATORY_CARE_PROVIDER_SITE_OTHER): Payer: Managed Care, Other (non HMO) | Admitting: Obstetrics and Gynecology

## 2015-08-13 ENCOUNTER — Ambulatory Visit (INDEPENDENT_AMBULATORY_CARE_PROVIDER_SITE_OTHER): Payer: Managed Care, Other (non HMO)

## 2015-08-13 ENCOUNTER — Encounter: Payer: Self-pay | Admitting: Nurse Practitioner

## 2015-08-13 VITALS — BP 140/78 | HR 64 | Ht 67.0 in | Wt 219.0 lb

## 2015-08-13 DIAGNOSIS — N9489 Other specified conditions associated with female genital organs and menstrual cycle: Secondary | ICD-10-CM

## 2015-08-13 DIAGNOSIS — R102 Pelvic and perineal pain: Secondary | ICD-10-CM

## 2015-08-13 DIAGNOSIS — R7309 Other abnormal glucose: Secondary | ICD-10-CM | POA: Diagnosis not present

## 2015-08-13 NOTE — Progress Notes (Signed)
Patient ID: Casey Chen, female   DOB: 03-06-1955, 60 y.o.   MRN: [id number] GYNECOLOGY  VISIT   HPI: 60 y.o.   Married  Caucasian  female   [phone] with Patient's last menstrual period was 01/11/2007.   here for pelvic ultrasound for _____pelvic pressure and check of ovaries_____________.    Patient is scheduled for surgery for prolapse and incontinence.   Hx of TIA x 2.   First was when she was on combined oral contraceptives. Second TIA occurred when she was off the birth control. Sometimes has weakness of her left hand.   No hx of stroke.  No hx of MI.  States hx of chest pain, which patient states is from reflux which is a long standing diagnosis.  No chest pain with exertion.  No shortness of breath with lying down or going up stairs.   Hx of elevated blood sugar.  Uncertain of current hemoglobin A1C level.  Not on medication for blood sugar control.  PCP - Velna Hatchet, MD.   GYNECOLOGIC HISTORY: Patient's last menstrual period was 01/11/2007. Contraception:  Tubal Menopausal hormone therapy:  None Last mammogram:  07-25-14 3D/Density B/Neg/biRads1:[facility] Last pap smear:   05-05-15 Neg:Neg HR HPV        OB History    Gravida Para Term Preterm AB Living   3 2 2   1 2    SAB TAB Ectopic Multiple Live Births   1       2         Patient Active Problem List   Diagnosis Date Noted  . 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 Depression 03/19/2013  . Other and unspecified hyperlipidemia 03/19/2013  . Postmenopausal 03/19/2013  . TIA (transient ischemic attack) 03/19/2013  . HTN (hypertension) 11/02/2012  . Numbness 09/20/2012  . Benign neoplasm of colon 06/08/2010  . Esophageal reflux 04/19/2010  . Dysphagia, unspecified(787.20) 04/19/2010    Past Medical History:  Diagnosis Date  . Depression   . Esophageal stricture 2011  . GERD (gastroesophageal reflux disease)   . Glucose intolerance (impaired glucose tolerance) 04/2015  . Head ache    . Hyperlipidemia   . Hypertension   . Pinched nerve in shoulder    left  . Stroke ([place]) 2013   TIA on OCP, NO ESTROGEN.  recurrent TIA 08/2012  . Weakness    left arm past TIA    Past Surgical History:  Procedure Laterality Date  . ESOPHAGOGASTRODUODENOSCOPY    . TUBAL LIGATION  1979  . UMBILICAL HERNIA REPAIR  1979    Current Outpatient Prescriptions  Medication Sig Dispense Refill  . aspirin 325 MG tablet Take 325 mg by mouth daily.    Marland Kitchen FLUoxetine (PROZAC) 20 MG capsule Take 40 mg by mouth daily.     . Melatonin 2.5 MG CAPS Take 2.5 mg by mouth at bedtime as needed (sleep).    Marland Kitchen omeprazole-sodium bicarbonate (ZEGERID) 40-1100 MG per capsule Take 1 capsule by mouth daily.    Marland Kitchen oxybutynin (DITROPAN XL) 10 MG 24 hr tablet Take 1 tablet (10 mg total) by mouth at bedtime. 90 tablet 1  . rosuvastatin (CRESTOR) 40 MG tablet Take 1 tablet by mouth daily.    . traMADol (ULTRAM) 50 MG tablet Take 1 tablet by mouth every 12 (twelve) hours as needed for moderate pain.     . verapamil (CALAN-SR) 240 MG CR tablet Take 240 mg by mouth at bedtime.     No current facility-administered medications  for this visit.      ALLERGIES: Review of patient's allergies indicates no known allergies.  Family History  Problem Relation Age of Onset  . Colon polyps Mother     father  . Irritable bowel syndrome Mother   . Lung cancer Mother   . Multiple births Mother     one set of twins  . Depression Mother   . Brain cancer Brother   . Diabetes Sister   . Heart disease Father   . Colon polyps Father   . Colon cancer Neg Hx     Social History   Social History  . Marital status: Married    Spouse name: N/A  . Number of children: 2  . Years of education: N/A   Occupational History  . bookkeeper    Social History Main Topics  . Smoking status: Former Smoker    Packs/day: 1.00    Years: 13.00    Types: Cigarettes    Quit date: 01/10/1990  . Smokeless tobacco: Never Used  . Alcohol use  0.6 oz/week    1 Glasses of wine per week  . Drug use: No  . Sexual activity: No     Comment: btl   Other Topics Concern  . Not on file   Social History Narrative  . No narrative on file    ROS:  Pertinent items are noted in HPI.  PHYSICAL EXAMINATION:    BP 140/78 (BP Location: Right Arm, Patient Position: Sitting, Cuff Size: Normal)   Pulse 64   Ht 5\' 7"  (1.702 m)   Wt 219 lb (99.3 kg)   LMP 01/11/2007 Comment: on Depo X 4 yrs prior  BMI 34.30 kg/m     General appearance: alert, cooperative and appears stated age   Ultrasound today - images and report reviewed with patient.  Normal uterus, ovaries, adnexa.  No free fluid.  ASSESSMENT  Pelvic pressure.  Normal pelvic ultrasound.  Prolapse and mixed incontinence.  Hx TIAs and no stroke.  Not estrogen candidate. Hx elevated hemoglobin A1C.  Not on medication.   PLAN  Discussed normal pelvic ultrasound.  Plan for BSO at time of hysterectomy/prolapse repair and sling.  Will check hemoglobin A1C today.  If in diabetic range, will need to see PCP prior to surgery. Return for preop visit.     An After Visit Summary was printed and given to the patient.  __15____ minutes face to face time of which over 50% was spent in counseling.

## 2015-08-14 LAB — HEMOGLOBIN A1C
HEMOGLOBIN A1C: 6.3 % — AB (ref <5.7)
Mean Plasma Glucose: 134 mg/dL

## 2015-09-15 NOTE — Patient Instructions (Addendum)
Your procedure is scheduled on:  Tuesday, Sept. 19, 2017  Enter through the [facility] at:  6:00 AM  Pick up the phone at the desk and dial [phone].  Call this number if you have problems the morning of surgery: [phone].  Remember: Do NOT eat food or drink after:  Midnight Monday, Sept. 18, 2017  Take these medicines the morning of surgery with a SIP OF WATER: None  Bring CPAP day of surgery  Do NOT wear jewelry (body piercing), metal hair clips/bobby pins, make-up, or nail polish. Do NOT wear lotions, powders, or perfumes.  You may wear deodorant. Do NOT shave for 48 hours prior to surgery. Do NOT bring valuables to the hospital. Contacts, dentures, or bridgework may not be worn into surgery.  Leave suitcase in car.  After surgery it may be brought to your room.  For patients admitted to the hospital, checkout time is 11:00 AM the day of discharge.

## 2015-09-16 ENCOUNTER — Encounter (HOSPITAL_COMMUNITY)
Admission: RE | Admit: 2015-09-16 | Discharge: 2015-09-16 | Disposition: A | Discharge status: Home / Self Care | Payer: Managed Care, Other (non HMO) | Source: Ambulatory Visit | Attending: Obstetrics and Gynecology | Admitting: Obstetrics and Gynecology

## 2015-09-16 ENCOUNTER — Encounter (HOSPITAL_COMMUNITY): Payer: Self-pay

## 2015-09-16 ENCOUNTER — Other Ambulatory Visit: Payer: Self-pay

## 2015-09-16 DIAGNOSIS — Z01818 Encounter for other preprocedural examination: Secondary | ICD-10-CM | POA: Insufficient documentation

## 2015-09-16 HISTORY — DX: Unspecified osteoarthritis, unspecified site: M19.90

## 2015-09-16 HISTORY — DX: Sleep apnea, unspecified: G47.30

## 2015-09-16 HISTORY — DX: Weakness: R53.1

## 2015-09-16 LAB — COMPREHENSIVE METABOLIC PANEL
ALBUMIN: 4.3 g/dL (ref 3.5–5.0)
ALK PHOS: 88 U/L (ref 38–126)
ALT: 20 U/L (ref 14–54)
ANION GAP: 7 (ref 5–15)
AST: 21 U/L (ref 15–41)
BUN: 16 mg/dL (ref 6–20)
CALCIUM: 10.4 mg/dL — AB (ref 8.9–10.3)
CO2: 30 mmol/L (ref 22–32)
Chloride: 100 mmol/L — ABNORMAL LOW (ref 101–111)
Creatinine, Ser: 1.09 mg/dL — ABNORMAL HIGH (ref 0.44–1.00)
GFR calc Af Amer: >60 mL/min (ref >60)
GFR calc non Af Amer: 54 mL/min — ABNORMAL LOW (ref >60)
GLUCOSE: 97 mg/dL (ref 65–99)
POTASSIUM: 3.5 mmol/L (ref 3.5–5.1)
SODIUM: 137 mmol/L (ref 135–145)
Total Bilirubin: 0.7 mg/dL (ref 0.3–1.2)
Total Protein: 8.3 g/dL — ABNORMAL HIGH (ref 6.5–8.1)

## 2015-09-16 LAB — CBC
HCT: 42 % (ref 36.0–46.0)
HEMOGLOBIN: 14.3 g/dL (ref 12.0–15.0)
MCH: 29.9 pg (ref 26.0–34.0)
MCHC: 34 g/dL (ref 30.0–36.0)
MCV: 87.7 fL (ref 78.0–100.0)
Platelets: 286 10*3/uL (ref 150–400)
RBC: 4.79 MIL/uL (ref 3.87–5.11)
RDW: 13.7 % (ref 11.5–15.5)
WBC: 8.4 10*3/uL (ref 4.0–10.5)

## 2015-09-17 ENCOUNTER — Ambulatory Visit (INDEPENDENT_AMBULATORY_CARE_PROVIDER_SITE_OTHER): Payer: Managed Care, Other (non HMO) | Admitting: Obstetrics and Gynecology

## 2015-09-17 ENCOUNTER — Encounter: Payer: Self-pay | Admitting: Obstetrics and Gynecology

## 2015-09-17 VITALS — BP 138/70 | HR 80 | Ht 67.0 in | Wt 218.8 lb

## 2015-09-17 DIAGNOSIS — N812 Incomplete uterovaginal prolapse: Secondary | ICD-10-CM | POA: Diagnosis not present

## 2015-09-17 DIAGNOSIS — N3946 Mixed incontinence: Secondary | ICD-10-CM

## 2015-09-17 NOTE — Progress Notes (Signed)
GYNECOLOGY  VISIT   HPI: 60 y.o.   Married  Caucasian  female   [phone] with Patient's last menstrual period was 01/11/2007.   here for surgical consult.   Desires surgery for prolapse and incontinence.   Has leakage of urine with cough, laugh, and sneeze, and for no reason.  Perirectal splinting for BMs.  Has known second degree cystocele, mild uterine prolapse, and first degree rectocele.  Ditropan XL 10 mg and caffeine reduction is helping control her leakage for no reason.  No longer really needing Depends.  Uses on a rare occasion.   Urodynamic testing on 06/03/15 -  Uroflow - Void 44 cc and PVR 100 cc. Continuous.  CMG - S1 - 7 cc. S3 - 106 cc. Multiple detrusor contractions noted. VLPP 89 cm H2O at 8 cc. UPP - 25 cm H2O at 106 cc.  Pressure flow - P Det max looks like it is about 75 cm H2O. Voided 220 cc  Normal pelvic ultrasound done 08/13/15 for evaluation of pelvic pressure.  Hx of TIA x 2.   First was when she was on combined oral contraceptives. Second TIA occurred when she was off the birth control. Sometimes has weakness of her left hand.   No hx of stroke.  No hx of MI.  States hx of chest pain, which patient states is from reflux which is a long standing diagnosis.  No chest pain with exertion.  No shortness of breath with lying down or going up stairs.   Hx of elevated blood sugar.  HgbA1C  6.3 on 08/13/15. Not on medication for blood sugar control.  Now taking daily Zyrtec for allergy symptoms.   Will see her PCP tomorrow in preparation for surgery.  PCP - Velna Hatchet, MD.   GYNECOLOGIC HISTORY: Patient's last menstrual period was 01/11/2007. Contraception:  Tubal ligation Menopausal hormone therapy:  none Last mammogram:  07-29-15 3D/Density B/oval mass upper outer quad.Lt.breast--U/S reveals 1mm duct ectasia of Lt.breast probably benign. Rec.f/u Lt.mmg and U/S in 29mo/BiRads3:[facility] Last pap smear:   05-05-15 Neg:Neg HR HPV        OB History     Gravida Para Term Preterm AB Living   3 2 2   1 2    SAB TAB Ectopic Multiple Live Births   1       2         Patient Active Problem List   Diagnosis Date Noted  . 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 Depression 03/19/2013  . Other and unspecified hyperlipidemia 03/19/2013  . Postmenopausal 03/19/2013  . TIA (transient ischemic attack) 03/19/2013  . HTN (hypertension) 11/02/2012  . Numbness 09/20/2012  . Benign neoplasm of colon 06/08/2010  . Esophageal reflux 04/19/2010  . Dysphagia, unspecified(787.20) 04/19/2010    Past Medical History:  Diagnosis Date  . Arthritis    right hip  . Depression   . Esophageal stricture 2011  . GERD (gastroesophageal reflux disease)   . Glucose intolerance (impaired glucose tolerance) 04/2015  . Head ache   . Hyperlipidemia   . Hypertension   . Left-sided weakness    due to TIA  . Pinched nerve in shoulder    left  . Sleep apnea   . Stroke ([place]) 2013   TIA on OCP, NO ESTROGEN.  recurrent TIA 08/2012  . Weakness    left arm past TIA    Past Surgical History:  Procedure Laterality Date  . ESOPHAGOGASTRODUODENOSCOPY    . TUBAL LIGATION  1979  . UMBILICAL HERNIA REPAIR  1979    Current Outpatient Prescriptions  Medication Sig Dispense Refill  . aspirin 325 MG tablet Take 325 mg by mouth daily.    . cetirizine (ZYRTEC) 10 MG tablet Take 10 mg by mouth daily.    Marland Kitchen. FLUoxetine (PROZAC) 20 MG capsule Take 40 mg by mouth daily.     . Melatonin 2.5 MG CAPS Take 2.5 mg by mouth at bedtime as needed (sleep).    Marland Kitchen. omeprazole-sodium bicarbonate (ZEGERID) 40-1100 MG per capsule Take 1 capsule by mouth daily.    Marland Kitchen. oxybutynin (DITROPAN XL) 10 MG 24 hr tablet Take 1 tablet (10 mg total) by mouth at bedtime. 90 tablet 1  . rosuvastatin (CRESTOR) 40 MG tablet Take 1 tablet by mouth daily.    . traMADol (ULTRAM) 50 MG tablet Take 1 tablet by mouth every 12 (twelve) hours as needed for moderate pain.     . verapamil  (CALAN-SR) 240 MG CR tablet Take 240 mg by mouth at bedtime.     No current facility-administered medications for this visit.      ALLERGIES: Review of patient's allergies indicates no known allergies.  Family History  Problem Relation Age of Onset  . Colon polyps Mother     father  . Irritable bowel syndrome Mother   . Lung cancer Mother   . Multiple births Mother     one set of twins  . Depression Mother   . Brain cancer Brother   . Diabetes Sister   . Heart disease Father   . Colon polyps Father   . Colon cancer Neg Hx     Social History   Social History  . Marital status: Married    Spouse name: N/A  . Number of children: 2  . Years of education: N/A   Occupational History  . bookkeeper    Social History Main Topics  . Smoking status: Former Smoker    Packs/day: 1.00    Years: 13.00    Types: Cigarettes    Quit date: 01/10/1990  . Smokeless tobacco: Never Used  . Alcohol use 0.6 oz/week    1 Glasses of wine per week  . Drug use: No  . Sexual activity: No     Comment: btl   Other Topics Concern  . Not on file   Social History Narrative  . No narrative on file    ROS:  Pertinent items are noted in HPI.  PHYSICAL EXAMINATION:    BP 138/70 (BP Location: Right Arm, Patient Position: Sitting, Cuff Size: Large)   Pulse 80   Ht 5\' 7"  (1.702 m)   Wt 218 lb 12.8 oz (99.2 kg)   LMP 01/11/2007 Comment: on Depo X 4 yrs prior  BMI 34.27 kg/m     General appearance: alert, cooperative and appears stated age Head: Normocephalic, without obvious abnormality, atraumatic Neck: no adenopathy, supple, symmetrical, trachea midline and thyroid normal to inspection and palpation Lungs: clear to auscultation bilaterally Heart: regular rate and rhythm Abdomen: soft, non-tender, no masses,  no organomegaly Extremities: extremities normal, atraumatic, no cyanosis or edema Skin: Skin color, texture, turgor normal. No rashes or lesions No abnormal inguinal nodes  palpated Neurologic: Grossly normal  Pelvic: External genitalia:  no lesions              Urethra:  normal appearing urethra with no masses, tenderness or lesions              Bartholins and  Skenes: normal                 Vagina: normal appearing vagina with normal color and discharge, no lesions. Second degree cystocele, mild uterine prolapse, and first degree rectocele. .               Cervix: no lesions                Bimanual Exam:  Uterus:  normal size, contour, position, consistency, mobility, non-tender              Adnexa: no mass, fullness, tenderness              Rectal exam: Yes.  .  Confirms.              Anus:  normal sphincter tone, no lesions  Chaperone was present for exam.  ASSESSMENT  Incomplete uterovaginal prolapse and mixed incontinence.  Hx TIAs and no stroke. Residual weakness in left arm.  Not estrogen candidate. Hx elevated hemoglobin A1C in prediabetic range. Sleep apnea.  Uses CPAP machine.  Hx umbilical hernia repair 38 years ago.  Does not know if she had mesh.   PLAN  Surgical care including LAVH/BSO/anterior and posterior colporrhaphy, TVT Exact midurethral sling and cystoscopy discussed.  Risks/benefits, and alternatives reviewed.  Patient wishes to proceed.  Discussed rare possibility of conversion to laparotomy.  She will bring her CPAP machine to the hospital.  Enema the night before surgery.  She will see her PCP tomorrow.    An After Visit Summary was printed and given to the patient.  _25_____ minutes face to face time of which over 50% was spent in counseling.

## 2015-09-24 DIAGNOSIS — Z0289 Encounter for other administrative examinations: Secondary | ICD-10-CM

## 2015-09-28 MED ORDER — DEXTROSE 5 % IV SOLN
2.0000 g | INTRAVENOUS | Status: AC
  Administered 2015-09-29: 2 g via INTRAVENOUS
  Filled 2015-09-28: qty 2

## 2015-09-29 ENCOUNTER — Encounter (HOSPITAL_COMMUNITY): Payer: Self-pay

## 2015-09-29 ENCOUNTER — Encounter (HOSPITAL_COMMUNITY)
Admission: RE | Disposition: A | Discharge status: Home / Self Care | Payer: Self-pay | Source: Ambulatory Visit | Attending: Obstetrics and Gynecology

## 2015-09-29 ENCOUNTER — Observation Stay (HOSPITAL_COMMUNITY)
Admission: RE | Admit: 2015-09-29 | Discharge: 2015-09-30 | Disposition: A | Discharge status: Home / Self Care | Payer: Managed Care, Other (non HMO) | Source: Ambulatory Visit | Attending: Obstetrics and Gynecology | Admitting: Obstetrics and Gynecology

## 2015-09-29 ENCOUNTER — Ambulatory Visit (HOSPITAL_COMMUNITY): Payer: Managed Care, Other (non HMO) | Admitting: Anesthesiology

## 2015-09-29 DIAGNOSIS — N393 Stress incontinence (female) (male): Secondary | ICD-10-CM | POA: Diagnosis not present

## 2015-09-29 DIAGNOSIS — Z87891 Personal history of nicotine dependence: Secondary | ICD-10-CM | POA: Insufficient documentation

## 2015-09-29 DIAGNOSIS — Z79899 Other long term (current) drug therapy: Secondary | ICD-10-CM | POA: Insufficient documentation

## 2015-09-29 DIAGNOSIS — Z8673 Personal history of transient ischemic attack (TIA), and cerebral infarction without residual deficits: Secondary | ICD-10-CM | POA: Diagnosis not present

## 2015-09-29 DIAGNOSIS — I10 Essential (primary) hypertension: Secondary | ICD-10-CM | POA: Diagnosis not present

## 2015-09-29 DIAGNOSIS — Z90710 Acquired absence of both cervix and uterus: Principal | ICD-10-CM | POA: Insufficient documentation

## 2015-09-29 DIAGNOSIS — N812 Incomplete uterovaginal prolapse: Secondary | ICD-10-CM | POA: Diagnosis not present

## 2015-09-29 DIAGNOSIS — Z7982 Long term (current) use of aspirin: Secondary | ICD-10-CM | POA: Insufficient documentation

## 2015-09-29 HISTORY — PX: ANTERIOR AND POSTERIOR REPAIR: SHX5121

## 2015-09-29 HISTORY — PX: SALPINGOOPHORECTOMY: SHX82

## 2015-09-29 HISTORY — PX: CYSTOSCOPY: SHX5120

## 2015-09-29 HISTORY — PX: BLADDER SUSPENSION: SHX72

## 2015-09-29 HISTORY — PX: LAPAROSCOPIC HYSTERECTOMY: SHX1926

## 2015-09-29 SURGERY — ANTERIOR (CYSTOCELE) AND POSTERIOR REPAIR (RECTOCELE)
Anesthesia: General | Site: Vagina

## 2015-09-29 MED ORDER — MORPHINE SULFATE 2 MG/ML IV SOLN
INTRAVENOUS | Status: DC
  Administered 2015-09-29 (×2): 1 mg via INTRAVENOUS
  Administered 2015-09-29: 13:00:00 via INTRAVENOUS
  Filled 2015-09-29: qty 25

## 2015-09-29 MED ORDER — DIPHENHYDRAMINE HCL 12.5 MG/5ML PO ELIX: 12.5000 mg | ORAL_SOLUTION | Freq: Four times a day (QID) | ORAL | Status: DC | PRN

## 2015-09-29 MED ORDER — MEPERIDINE HCL 25 MG/ML IJ SOLN: 6.2500 mg | INTRAMUSCULAR | Status: DC | PRN

## 2015-09-29 MED ORDER — GLYCOPYRROLATE 0.2 MG/ML IJ SOLN
INTRAMUSCULAR | Status: DC | PRN
  Administered 2015-09-29: .05 mg via INTRAVENOUS
  Administered 2015-09-29: 0.4 mg via INTRAVENOUS

## 2015-09-29 MED ORDER — LIDOCAINE HCL (CARDIAC) 20 MG/ML IV SOLN
INTRAVENOUS | Status: AC
  Filled 2015-09-29: qty 5

## 2015-09-29 MED ORDER — NALOXONE HCL 0.4 MG/ML IJ SOLN: 0.4000 mg | INTRAMUSCULAR | Status: DC | PRN

## 2015-09-29 MED ORDER — DEXAMETHASONE SODIUM PHOSPHATE 10 MG/ML IJ SOLN
INTRAMUSCULAR | Status: AC
  Filled 2015-09-29: qty 1

## 2015-09-29 MED ORDER — DEXAMETHASONE SODIUM PHOSPHATE 4 MG/ML IJ SOLN
INTRAMUSCULAR | Status: DC | PRN
  Administered 2015-09-29: 4 mg via INTRAVENOUS

## 2015-09-29 MED ORDER — LACTATED RINGERS IV SOLN
INTRAVENOUS | Status: DC
  Administered 2015-09-29: 10:00:00 via INTRAVENOUS
  Administered 2015-09-29: 125 mL/h via INTRAVENOUS

## 2015-09-29 MED ORDER — PHENYLEPHRINE 40 MCG/ML (10ML) SYRINGE FOR IV PUSH (FOR BLOOD PRESSURE SUPPORT)
PREFILLED_SYRINGE | INTRAVENOUS | Status: AC
  Filled 2015-09-29: qty 10

## 2015-09-29 MED ORDER — FENTANYL CITRATE (PF) 100 MCG/2ML IJ SOLN: 25.0000 ug | INTRAMUSCULAR | Status: DC | PRN

## 2015-09-29 MED ORDER — BUPIVACAINE HCL (PF) 0.25 % IJ SOLN
INTRAMUSCULAR | Status: DC | PRN
  Administered 2015-09-29: 10 mL

## 2015-09-29 MED ORDER — METOCLOPRAMIDE HCL 5 MG/ML IJ SOLN: 10.0000 mg | Freq: Once | INTRAMUSCULAR | Status: DC | PRN

## 2015-09-29 MED ORDER — KETOROLAC TROMETHAMINE 15 MG/ML IJ SOLN
15.0000 mg | Freq: Four times a day (QID) | INTRAMUSCULAR | Status: DC
  Administered 2015-09-29 – 2015-09-30 (×3): 15 mg via INTRAVENOUS
  Filled 2015-09-29 (×5): qty 1

## 2015-09-29 MED ORDER — LIDOCAINE-EPINEPHRINE 1 %-1:100000 IJ SOLN
INTRAMUSCULAR | Status: DC | PRN
  Administered 2015-09-29: 10 mL
  Administered 2015-09-29: 4 mL

## 2015-09-29 MED ORDER — ONDANSETRON HCL 4 MG/2ML IJ SOLN
INTRAMUSCULAR | Status: AC
Start: 2015-09-29 — End: 2015-09-29
  Filled 2015-09-29: qty 2

## 2015-09-29 MED ORDER — PROPOFOL 10 MG/ML IV BOLUS
INTRAVENOUS | Status: DC | PRN
  Administered 2015-09-29: 180 mg via INTRAVENOUS

## 2015-09-29 MED ORDER — HYDROMORPHONE HCL 1 MG/ML IJ SOLN
INTRAMUSCULAR | Status: AC
  Filled 2015-09-29: qty 1

## 2015-09-29 MED ORDER — LACTATED RINGERS IV SOLN
INTRAVENOUS | Status: DC
  Administered 2015-09-29: 125 mL/h via INTRAVENOUS

## 2015-09-29 MED ORDER — HYDROMORPHONE HCL 1 MG/ML IJ SOLN
INTRAMUSCULAR | Status: DC | PRN
  Administered 2015-09-29 (×2): 0.5 mg via INTRAVENOUS

## 2015-09-29 MED ORDER — PROPOFOL 10 MG/ML IV BOLUS
INTRAVENOUS | Status: AC
  Filled 2015-09-29: qty 20

## 2015-09-29 MED ORDER — ONDANSETRON HCL 4 MG/2ML IJ SOLN
INTRAMUSCULAR | Status: DC | PRN
  Administered 2015-09-29: 4 mg via INTRAVENOUS

## 2015-09-29 MED ORDER — DIPHENHYDRAMINE HCL 50 MG/ML IJ SOLN: 12.5000 mg | Freq: Four times a day (QID) | INTRAMUSCULAR | Status: DC | PRN

## 2015-09-29 MED ORDER — MIDAZOLAM HCL 2 MG/2ML IJ SOLN
INTRAMUSCULAR | Status: DC | PRN
  Administered 2015-09-29: 1 mg via INTRAVENOUS

## 2015-09-29 MED ORDER — ONDANSETRON HCL 4 MG PO TABS
4.0000 mg | ORAL_TABLET | Freq: Four times a day (QID) | ORAL | Status: DC | PRN
Start: 2015-09-29 — End: 2015-09-30

## 2015-09-29 MED ORDER — OXYCODONE-ACETAMINOPHEN 5-325 MG PO TABS: 1.0000 | ORAL_TABLET | ORAL | Status: DC | PRN

## 2015-09-29 MED ORDER — LACTATED RINGERS IR SOLN
Status: DC | PRN
  Administered 2015-09-29: 3000 mL

## 2015-09-29 MED ORDER — LACTATED RINGERS IV SOLN
INTRAVENOUS | Status: DC
  Administered 2015-09-29: 125 mL/h via INTRAVENOUS
  Administered 2015-09-30: 04:00:00 via INTRAVENOUS

## 2015-09-29 MED ORDER — ESTRADIOL 0.1 MG/GM VA CREA
TOPICAL_CREAM | VAGINAL | Status: AC
  Filled 2015-09-29: qty 42.5

## 2015-09-29 MED ORDER — ROCURONIUM BROMIDE 100 MG/10ML IV SOLN
INTRAVENOUS | Status: AC
  Filled 2015-09-29: qty 1

## 2015-09-29 MED ORDER — SODIUM CHLORIDE 0.9% FLUSH: 9.0000 mL | INTRAVENOUS | Status: DC | PRN

## 2015-09-29 MED ORDER — GLYCOPYRROLATE 0.2 MG/ML IJ SOLN
INTRAMUSCULAR | Status: AC
  Filled 2015-09-29: qty 1

## 2015-09-29 MED ORDER — IBUPROFEN 600 MG PO TABS: 600.0000 mg | ORAL_TABLET | Freq: Four times a day (QID) | ORAL | Status: DC | PRN

## 2015-09-29 MED ORDER — BUPIVACAINE HCL (PF) 0.25 % IJ SOLN
INTRAMUSCULAR | Status: AC
  Filled 2015-09-29: qty 30

## 2015-09-29 MED ORDER — LIDOCAINE-EPINEPHRINE 1 %-1:100000 IJ SOLN
INTRAMUSCULAR | Status: AC
  Filled 2015-09-29: qty 1

## 2015-09-29 MED ORDER — MENTHOL 3 MG MT LOZG: 1.0000 | LOZENGE | OROMUCOSAL | Status: DC | PRN

## 2015-09-29 MED ORDER — MIDAZOLAM HCL 2 MG/2ML IJ SOLN
INTRAMUSCULAR | Status: AC
  Filled 2015-09-29: qty 2

## 2015-09-29 MED ORDER — ROCURONIUM BROMIDE 100 MG/10ML IV SOLN
INTRAVENOUS | Status: DC | PRN
  Administered 2015-09-29 (×3): 10 mg via INTRAVENOUS
  Administered 2015-09-29: 50 mg via INTRAVENOUS

## 2015-09-29 MED ORDER — DEXAMETHASONE SODIUM PHOSPHATE 4 MG/ML IJ SOLN
INTRAMUSCULAR | Status: AC
  Filled 2015-09-29: qty 1

## 2015-09-29 MED ORDER — STERILE WATER FOR IRRIGATION IR SOLN
Status: DC | PRN
  Administered 2015-09-29: 1000 mL via INTRAVESICAL

## 2015-09-29 MED ORDER — FENTANYL CITRATE (PF) 100 MCG/2ML IJ SOLN
INTRAMUSCULAR | Status: AC
  Filled 2015-09-29: qty 6

## 2015-09-29 MED ORDER — KETOROLAC TROMETHAMINE 30 MG/ML IJ SOLN
INTRAMUSCULAR | Status: AC
  Administered 2015-09-29: 15 mg
  Filled 2015-09-29: qty 1

## 2015-09-29 MED ORDER — LIDOCAINE HCL (CARDIAC) 20 MG/ML IV SOLN
INTRAVENOUS | Status: DC | PRN
  Administered 2015-09-29: 80 mg via INTRAVENOUS

## 2015-09-29 MED ORDER — FENTANYL CITRATE (PF) 100 MCG/2ML IJ SOLN
INTRAMUSCULAR | Status: DC | PRN
  Administered 2015-09-29 (×2): 50 ug via INTRAVENOUS
  Administered 2015-09-29: 100 ug via INTRAVENOUS
  Administered 2015-09-29: 50 ug via INTRAVENOUS

## 2015-09-29 MED ORDER — NEOSTIGMINE METHYLSULFATE 10 MG/10ML IV SOLN
INTRAVENOUS | Status: AC
  Filled 2015-09-29: qty 1

## 2015-09-29 MED ORDER — NEOSTIGMINE METHYLSULFATE 10 MG/10ML IV SOLN
INTRAVENOUS | Status: DC | PRN
  Administered 2015-09-29: 2 mg via INTRAVENOUS

## 2015-09-29 MED ORDER — ONDANSETRON HCL 4 MG/2ML IJ SOLN
4.0000 mg | Freq: Four times a day (QID) | INTRAMUSCULAR | Status: DC | PRN
Start: 2015-09-29 — End: 2015-09-30

## 2015-09-29 MED ORDER — PHENYLEPHRINE HCL 10 MG/ML IJ SOLN
INTRAMUSCULAR | Status: DC | PRN
  Administered 2015-09-29: 40 ug via INTRAVENOUS
  Administered 2015-09-29: 80 ug via INTRAVENOUS

## 2015-09-29 SURGICAL SUPPLY — 91 items
BLADE SURG 11 STRL SS (BLADE) ×7 IMPLANT
BLADE SURG 15 STRL LF C SS BP (BLADE) ×5 IMPLANT
BLADE SURG 15 STRL SS (BLADE) ×2
CANISTER SUCT 3000ML (MISCELLANEOUS) ×7 IMPLANT
CATH FOLEY 2W 5CC 18FR LF (CATHETERS) ×7 IMPLANT
CATH FOLEY 2WAY SLVR  5CC 18FR (CATHETERS)
CATH FOLEY 2WAY SLVR 5CC 18FR (CATHETERS) IMPLANT
CATH ROBINSON RED A/P 16FR (CATHETERS) IMPLANT
CLOSURE WOUND 1/4 X3 (GAUZE/BANDAGES/DRESSINGS)
CLOTH BEACON ORANGE TIMEOUT ST (SAFETY) ×7 IMPLANT
CONT PATH 16OZ SNAP LID 3702 (MISCELLANEOUS) ×7 IMPLANT
COUNTER NEEDLE 1200 MAGNETIC (NEEDLE) ×7 IMPLANT
COVER BACK TABLE 60X90IN (DRAPES) ×14 IMPLANT
DECANTER SPIKE VIAL GLASS SM (MISCELLANEOUS) ×14 IMPLANT
DEVICE CAPIO SLIM SINGLE (INSTRUMENTS) IMPLANT
DRSG COVADERM PLUS 2X2 (GAUZE/BANDAGES/DRESSINGS) IMPLANT
DRSG OPSITE POSTOP 3X4 (GAUZE/BANDAGES/DRESSINGS) ×7 IMPLANT
DURAPREP 26ML APPLICATOR (WOUND CARE) ×7 IMPLANT
ELECT REM PT RETURN 9FT ADLT (ELECTROSURGICAL) ×7
ELECTRODE REM PT RTRN 9FT ADLT (ELECTROSURGICAL) ×5 IMPLANT
FILTER SMOKE EVAC LAPAROSHD (FILTER) ×7 IMPLANT
FORCEPS CUTTING 33CM 5MM (CUTTING FORCEPS) IMPLANT
GAUZE PACKING 1 X5 YD ST (GAUZE/BANDAGES/DRESSINGS) ×7 IMPLANT
GAUZE PACKING 2X5 YD STRL (GAUZE/BANDAGES/DRESSINGS) ×7 IMPLANT
GLOVE BIO SURGEON STRL SZ 6.5 (GLOVE) ×12 IMPLANT
GLOVE BIO SURGEONS STRL SZ 6.5 (GLOVE) ×2
GLOVE BIOGEL PI IND STRL 6.5 (GLOVE) ×5 IMPLANT
GLOVE BIOGEL PI IND STRL 7.0 (GLOVE) ×15 IMPLANT
GLOVE BIOGEL PI INDICATOR 6.5 (GLOVE) ×2
GLOVE BIOGEL PI INDICATOR 7.0 (GLOVE) ×6
GOWN STRL REUS W/TWL LRG LVL3 (GOWN DISPOSABLE) ×28 IMPLANT
LEGGING LITHOTOMY PAIR STRL (DRAPES) ×7 IMPLANT
LIGASURE VESSEL 5MM BLUNT TIP (ELECTROSURGICAL) ×7 IMPLANT
LIQUID BAND (GAUZE/BANDAGES/DRESSINGS) ×7 IMPLANT
NEEDLE HYPO 22GX1.5 SAFETY (NEEDLE) ×7 IMPLANT
NEEDLE INSUFFLATION 150MM (ENDOMECHANICALS) ×7 IMPLANT
NEEDLE MAYO 6 CRC TAPER PT (NEEDLE) IMPLANT
NS IRRIG 1000ML POUR BTL (IV SOLUTION) IMPLANT
OCCLUDER COLPOPNEUMO (BALLOONS) ×7 IMPLANT
PACK LAVH (CUSTOM PROCEDURE TRAY) ×7 IMPLANT
PACK ROBOTIC GOWN (GOWN DISPOSABLE) ×7 IMPLANT
PACK VAGINAL MINOR WOMEN LF (CUSTOM PROCEDURE TRAY) IMPLANT
PACK VAGINAL WOMENS (CUSTOM PROCEDURE TRAY) ×7 IMPLANT
PAD MAGNETIC INST (MISCELLANEOUS) ×7 IMPLANT
PAD TRENDELENBURG POSITION (MISCELLANEOUS) ×7 IMPLANT
PLUG CATH AND CAP STER (CATHETERS) ×7 IMPLANT
POUCH LAPAROSCOPIC INSTRUMENT (MISCELLANEOUS) ×7 IMPLANT
SCISSORS LAP 5X35 DISP (ENDOMECHANICALS) ×7 IMPLANT
SCISSORS MNPLR CVD DVNC (INSTRUMENTS) ×5 IMPLANT
SCISSORS MONOPOLAR CVD (INSTRUMENTS) ×2
SET CYSTO W/LG BORE CLAMP LF (SET/KITS/TRAYS/PACK) ×7 IMPLANT
SET IRRIG TUBING LAPAROSCOPIC (IRRIGATION / IRRIGATOR) ×7 IMPLANT
SET TRI-LUMEN FLTR TB AIRSEAL (TUBING) ×7 IMPLANT
SLEEVE ADV FIXATION 5X100MM (TROCAR) ×21 IMPLANT
SLEEVE XCEL OPT CAN 5 100 (ENDOMECHANICALS) IMPLANT
SLING TVT EXACT (Sling) ×7 IMPLANT
SOLUTION ELECTROLUBE (MISCELLANEOUS) IMPLANT
SPONGE SURGIFOAM ABS GEL 12-7 (HEMOSTASIS) IMPLANT
STRIP CLOSURE SKIN 1/4X3 (GAUZE/BANDAGES/DRESSINGS) IMPLANT
SURGIFLO TRUKIT (HEMOSTASIS) IMPLANT
SURGIFLO W/THROMBIN 8M KIT (HEMOSTASIS) ×7 IMPLANT
SUT CAPIO ETHIBPND (SUTURE) IMPLANT
SUT VIC AB 0 CT1 18XCR BRD8 (SUTURE) ×10 IMPLANT
SUT VIC AB 0 CT1 27 (SUTURE) ×6
SUT VIC AB 0 CT1 27XBRD ANBCTR (SUTURE) ×15 IMPLANT
SUT VIC AB 0 CT1 36 (SUTURE) ×7 IMPLANT
SUT VIC AB 0 CT1 8-18 (SUTURE) ×4
SUT VIC AB 0 CT2 27 (SUTURE) IMPLANT
SUT VIC AB 2-0 CT1 27 (SUTURE)
SUT VIC AB 2-0 CT1 TAPERPNT 27 (SUTURE) IMPLANT
SUT VIC AB 2-0 CT2 27 (SUTURE) IMPLANT
SUT VIC AB 2-0 SH 27 (SUTURE) ×4
SUT VIC AB 2-0 SH 27XBRD (SUTURE) ×10 IMPLANT
SUT VIC AB 2-0 UR6 27 (SUTURE) IMPLANT
SUT VICRYL 0 TIES 12 18 (SUTURE) ×7 IMPLANT
SUT VICRYL 4-0 PS2 18IN ABS (SUTURE) ×7 IMPLANT
SYRINGE 60CC LL (MISCELLANEOUS) ×7 IMPLANT
TIP UTERINE 5.1X6CM LAV DISP (MISCELLANEOUS) IMPLANT
TIP UTERINE 6.7X10CM GRN DISP (MISCELLANEOUS) IMPLANT
TIP UTERINE 6.7X6CM WHT DISP (MISCELLANEOUS) IMPLANT
TIP UTERINE 6.7X8CM BLUE DISP (MISCELLANEOUS) ×7 IMPLANT
TOWEL OR 17X24 6PK STRL BLUE (TOWEL DISPOSABLE) ×21 IMPLANT
TRAY FOLEY CATH SILVER 14FR (SET/KITS/TRAYS/PACK) IMPLANT
TRAY FOLEY CATH SILVER 16FR (SET/KITS/TRAYS/PACK) ×7 IMPLANT
TROCAR ADV FIXATION 5X100MM (TROCAR) ×7 IMPLANT
TROCAR PORT AIRSEAL 8X120 (TROCAR) ×7 IMPLANT
TROCAR XCEL NON-BLD 5MMX100MML (ENDOMECHANICALS) IMPLANT
TUBING NON-CON 1/4 X 20 CONN (TUBING) ×6 IMPLANT
TUBING NON-CON 1/4 X 20' CONN (TUBING) ×1
WARMER LAPAROSCOPE (MISCELLANEOUS) ×7 IMPLANT
WATER STERILE IRR 1000ML POUR (IV SOLUTION) ×7 IMPLANT

## 2015-09-29 NOTE — Progress Notes (Signed)
Day of Surgery Procedure(s) (LRB): ANTERIOR (CYSTOCELE) AND POSTERIOR REPAIR (RECTOCELE) (N/A) TRANSVAGINAL TAPE (TVT) PROCEDURE exact mid-urethral sling (N/A) CYSTOSCOPY (N/A) HYSTERECTOMY TOTAL LAPAROSCOPIC (N/A) SALPINGO OOPHORECTOMY (Bilateral)  Subjective: Patient reports good pain control.  Wants to eat.  Denies nausea.  Objective: I have reviewed patient's vital signs and intake and output. Vitals:   09/29/15 1315 09/29/15 1415  BP: 134/73 134/71  Pulse: 82 79  Resp: 16 16  Temp: 98 F (36.7 C) 97.9 F (36.6 C)   UO - 550 cc clear, yellow urine.  General: alert and cooperative Resp: clear to auscultation bilaterally Cardio: regular rate and rhythm, S1, S2 normal, no murmur, click, rub or gallop GI: soft, non-tender; bowel sounds normal; no masses,  no organomegaly and incision: clean, dry and intact Extremities: PAS and TED hose on.  DPs 2+ bilaterally.  Vaginal Bleeding: minimal  Assessment: s/p Procedure(s): ANTERIOR (CYSTOCELE) AND POSTERIOR REPAIR (RECTOCELE) (N/A) TRANSVAGINAL TAPE (TVT) PROCEDURE exact mid-urethral sling (N/A) CYSTOSCOPY (N/A) HYSTERECTOMY TOTAL LAPAROSCOPIC (N/A) SALPINGO OOPHORECTOMY (Bilateral):  Stable post op.   Plan: Advance diet Morphine PCA and Toradol.  CBC and CMP in am.  Foley and vaginal packing overnight.  Surgical findings and procedure reviewed.  Questions answered.   LOS: 0 days    Arloa Koh 09/29/2015, 2:52 PM

## 2015-09-29 NOTE — Anesthesia Preprocedure Evaluation (Signed)
Anesthesia Evaluation  Patient identified by MRN, date of birth, ID band Patient awake    Reviewed: Allergy & Precautions, NPO status , Patient's Chart, lab work & pertinent test results  Airway Mallampati: II  TM Distance: >3 FB Neck ROM: Full    Dental no notable dental hx.    Pulmonary neg pulmonary ROS, former smoker,    Pulmonary exam normal breath sounds clear to auscultation       Cardiovascular hypertension, Pt. on medications Normal cardiovascular exam Rhythm:Regular Rate:Normal     Neuro/Psych TIAnegative psych ROS   GI/Hepatic negative GI ROS, Neg liver ROS,   Endo/Other  negative endocrine ROS  Renal/GU negative Renal ROS  negative genitourinary   Musculoskeletal negative musculoskeletal ROS (+)   Abdominal   Peds negative pediatric ROS (+)  Hematology negative hematology ROS (+)   Anesthesia Other Findings   Reproductive/Obstetrics negative OB ROS                             Anesthesia Physical Anesthesia Plan  ASA: II  Anesthesia Plan: General   Post-op Pain Management:    Induction: Intravenous  Airway Management Planned: Oral ETT  Additional Equipment:   Intra-op Plan:   Post-operative Plan: Extubation in OR  Informed Consent: I have reviewed the patients History and Physical, chart, labs and discussed the procedure including the risks, benefits and alternatives for the proposed anesthesia with the patient or authorized representative who has indicated his/her understanding and acceptance.   Dental advisory given  Plan Discussed with: CRNA  Anesthesia Plan Comments:         Anesthesia Quick Evaluation

## 2015-09-29 NOTE — Addendum Note (Signed)
Addendum  created 09/29/15 1827 by Hewitt Blade, CRNA   Sign clinical note

## 2015-09-29 NOTE — Anesthesia Postprocedure Evaluation (Signed)
Anesthesia Post Note  Patient: Casey Chen  Procedure(s) Performed: Procedure(s) (LRB): ANTERIOR (CYSTOCELE) AND POSTERIOR REPAIR (RECTOCELE) (N/A) TRANSVAGINAL TAPE (TVT) PROCEDURE exact mid-urethral sling (N/A) CYSTOSCOPY (N/A) HYSTERECTOMY TOTAL LAPAROSCOPIC (N/A) SALPINGO OOPHORECTOMY (Bilateral)  Patient location during evaluation: PACU Anesthesia Type: General Level of consciousness: sedated Pain management: pain level controlled Vital Signs Assessment: post-procedure vital signs reviewed and stable Respiratory status: spontaneous breathing Cardiovascular status: stable Postop Assessment: no signs of nausea or vomiting Anesthetic complications: no     Last Vitals:  Vitals:   09/29/15 1200 09/29/15 1215  BP: 135/72 132/77  Pulse: 82 80  Resp: 17 12  Temp:      Last Pain:  Vitals:   09/29/15 1215  TempSrc:   PainSc: Asleep   Pain Goal: Patients Stated Pain Goal: 5 (09/29/15 1200)               Haruna Rohlfs JR,JOHN Mateo Flow

## 2015-09-29 NOTE — Brief Op Note (Signed)
09/29/2015  11:19 AM  PATIENT:  Casey Chen  60 y.o. female  PRE-OPERATIVE DIAGNOSIS:  incomplete uterine prolapse, cystocele, rectocele, mixed urinary incontinence  POST-OPERATIVE DIAGNOSIS:  incomplete uterine prolapse, cystocele, rectocele, mixed urinary incontinence  PROCEDURE:  Procedure(s): ANTERIOR (CYSTOCELE) AND POSTERIOR REPAIR (RECTOCELE) (N/A) TRANSVAGINAL TAPE (TVT) PROCEDURE exact mid-urethral sling (N/A) CYSTOSCOPY (N/A) HYSTERECTOMY TOTAL LAPAROSCOPIC (N/A) SALPINGO OOPHORECTOMY (Bilateral)  SURGEON:  Surgeon(s) and Role:    * Adelae Yodice E Yisroel Ramming, MD - Primary    * Megan Salon, MD - Assisting  PHYSICIAN ASSISTANT: NA  ASSISTANTS: Lyman Speller, MD   ANESTHESIA:   local and general  EBL:  Total I/O In: 1000 [I.V.:1000] Out: 320 [Urine:150; Blood:170]  BLOOD ADMINISTERED:none  DRAINS: Urinary Catheter (Foley)   LOCAL MEDICATIONS USED:  MARCAINE    and OTHER Lidocaine 1% with epi 1:100,000.  SPECIMEN:  Source of Specimen:  Uterus, cervix, bilateral tubes and ovaries.  DISPOSITION OF SPECIMEN:  PATHOLOGY  COUNTS:  YES  TOURNIQUET:  * No tourniquets in log *  DICTATION: .Other Dictation: Dictation Number    PLAN OF CARE: Admit for overnight observation  PATIENT DISPOSITION:  PACU - hemodynamically stable.   Delay start of Pharmacological VTE agent (>24hrs) due to surgical blood loss or risk of bleeding: not applicable

## 2015-09-29 NOTE — Progress Notes (Signed)
Preop Note  No marked change in status since office preop visit.   Will check CMP and CBC post op.  May do a total laparoscopic hysterectomy depending on level of prolapse of uterus during surgery.  All of this explained to patient.   OK to proceed.

## 2015-09-29 NOTE — H&P (Signed)
Office Visit   09/17/2015 [place], MD  Obstetrics and Gynecology   Uterovaginal prolapse, incomplete +1 more  Dx   Surgical Consult ; Referred by Velna Hatchet, MD  Reason for Visit   Additional Documentation   Vitals:   BP 138/70 (BP Location: Right Arm, Patient Position: Sitting, Cuff Size: Large)   Pulse 80   Ht 5\' 7"  (1.702 m)   Wt 218 lb 12.8 oz (99.2 kg)   LMP 01/11/2007    BMI 34.27 kg/m   BSA 2.17 m      More Vitals   Flowsheets:   Custom Formula Data,   MEWS Score,   Anthropometrics,   Infectious Disease Screening     Encounter Info:   Billing Info,   History,   Allergies,   Detailed Report     All Notes   Progress Notes by Nunzio Cobbs, MD at 09/17/2015 2:30 PM   Author: Nunzio Cobbs, MD Author Type: Physician Filed: 09/19/2015 7:48 PM  Note Status: Signed Cosign: Cosign Not Required Encounter Date: 09/17/2015  Editor: Nunzio Cobbs, MD (Physician)  Prior Versions: 1. Lowella Fairy, CMA (Certified Medical Assistant) at 09/17/2015 2:43 PM - Sign at close encounter    GYNECOLOGY  VISIT   HPI: 60 y.o.   Married  Caucasian  female   [phone] with Patient's last menstrual period was 01/11/2007.   here for surgical consult.   Desires surgery for prolapse and incontinence.   Has leakage of urine with cough, laugh, and sneeze, and for no reason.  Perirectal splinting for BMs.  Has known second degree cystocele, mild uterine prolapse, and first degree rectocele.  Ditropan XL 10 mg and caffeine reduction is helping control her leakage for no reason.  No longer really needing Depends.  Uses on a rare occasion.   Urodynamic testing on 06/03/15 -  Uroflow - Void 44 cc and PVR 100 cc. Continuous.  CMG - S1 - 7 cc. S3 - 106 cc. Multiple detrusor contractions noted. VLPP 89 cm H2O at 8 cc. UPP - 25 cm H2O at 106 cc.  Pressure flow - P Det max looks like it is about 75 cm H2O.  Voided 220 cc  Normal pelvic ultrasound done 08/13/15 for evaluation of pelvic pressure.  Hx of TIA x 2.  First was when she was on combined oral contraceptives. Second TIA occurred when she was off the birth control. Sometimes has weakness of her left hand.   No hx of stroke.  No hx of MI.  States hx of chest pain, which patient states is from reflux which is a long standing diagnosis.  No chest pain with exertion.  No shortness of breath with lying down or going up stairs.   Hx of elevated blood sugar.  HgbA1C  6.3 on 08/13/15. Not on medication for blood sugar control.  Now taking daily Zyrtec for allergy symptoms.   Will see her PCP tomorrow in preparation for surgery.  PCP - Velna Hatchet, MD.   GYNECOLOGIC HISTORY: Patient's last menstrual period was 01/11/2007. Contraception:  Tubal ligation Menopausal hormone therapy:  none Last mammogram:  07-29-15 3D/Density B/oval mass upper outer quad.Lt.breast--U/S reveals 18mm duct ectasia of Lt.breast probably benign. Rec.f/u Lt.mmg and U/S in 45mo/BiRads3:[facility] Last pap smear:   05-05-15 Neg:Neg HR HPV                OB History  Gravida Para Term Preterm AB Living   3 2 2   1 2    SAB TAB Ectopic Multiple Live Births   1       2             Patient Active Problem List   Diagnosis Date Noted  . 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 Depression 03/19/2013  . Other and unspecified hyperlipidemia 03/19/2013  . Postmenopausal 03/19/2013  . TIA (transient ischemic attack) 03/19/2013  . HTN (hypertension) 11/02/2012  . Numbness 09/20/2012  . Benign neoplasm of colon 06/08/2010  . Esophageal reflux 04/19/2010  . Dysphagia, unspecified(787.20) 04/19/2010        Past Medical History:  Diagnosis Date  . Arthritis    right hip  . Depression   . Esophageal stricture 2011  . GERD (gastroesophageal reflux disease)   . Glucose intolerance (impaired glucose tolerance) 04/2015  . Head  ache   . Hyperlipidemia   . Hypertension   . Left-sided weakness    due to TIA  . Pinched nerve in shoulder    left  . Sleep apnea   . Stroke ([place]) 2013   TIA on OCP, NO ESTROGEN.  recurrent TIA 08/2012  . Weakness    left arm past TIA         Past Surgical History:  Procedure Laterality Date  . ESOPHAGOGASTRODUODENOSCOPY    . TUBAL LIGATION  1979  . UMBILICAL HERNIA REPAIR  1979          Current Outpatient Prescriptions  Medication Sig Dispense Refill  . aspirin 325 MG tablet Take 325 mg by mouth daily.    . cetirizine (ZYRTEC) 10 MG tablet Take 10 mg by mouth daily.    Marland Kitchen FLUoxetine (PROZAC) 20 MG capsule Take 40 mg by mouth daily.     . Melatonin 2.5 MG CAPS Take 2.5 mg by mouth at bedtime as needed (sleep).    Marland Kitchen omeprazole-sodium bicarbonate (ZEGERID) 40-1100 MG per capsule Take 1 capsule by mouth daily.    Marland Kitchen oxybutynin (DITROPAN XL) 10 MG 24 hr tablet Take 1 tablet (10 mg total) by mouth at bedtime. 90 tablet 1  . rosuvastatin (CRESTOR) 40 MG tablet Take 1 tablet by mouth daily.    . traMADol (ULTRAM) 50 MG tablet Take 1 tablet by mouth every 12 (twelve) hours as needed for moderate pain.     . verapamil (CALAN-SR) 240 MG CR tablet Take 240 mg by mouth at bedtime.     No current facility-administered medications for this visit.      ALLERGIES: Review of patient's allergies indicates no known allergies.        Family History  Problem Relation Age of Onset  . Colon polyps Mother     father  . Irritable bowel syndrome Mother   . Lung cancer Mother   . Multiple births Mother     one set of twins  . Depression Mother   . Brain cancer Brother   . Diabetes Sister   . Heart disease Father   . Colon polyps Father   . Colon cancer Neg Hx     Social History        Social History  . Marital status: Married    Spouse name: N/A  . Number of children: 2  . Years of education: N/A       Occupational  History  . bookkeeper          Social History Main  Topics  . Smoking status: Former Smoker    Packs/day: 1.00    Years: 13.00    Types: Cigarettes    Quit date: 01/10/1990  . Smokeless tobacco: Never Used  . Alcohol use 0.6 oz/week    1 Glasses of wine per week  . Drug use: No  . Sexual activity: No     Comment: btl       Other Topics Concern  . Not on file      Social History Narrative  . No narrative on file    ROS:  Pertinent items are noted in HPI.  PHYSICAL EXAMINATION:    BP 138/70 (BP Location: Right Arm, Patient Position: Sitting, Cuff Size: Large)   Pulse 80   Ht 5\' 7"  (1.702 m)   Wt 218 lb 12.8 oz (99.2 kg)   LMP 01/11/2007 Comment: on Depo X 4 yrs prior  BMI 34.27 kg/m     General appearance: alert, cooperative and appears stated age Head: Normocephalic, without obvious abnormality, atraumatic Neck: no adenopathy, supple, symmetrical, trachea midline and thyroid normal to inspection and palpation Lungs: clear to auscultation bilaterally Heart: regular rate and rhythm Abdomen: soft, non-tender, no masses,  no organomegaly Extremities: extremities normal, atraumatic, no cyanosis or edema Skin: Skin color, texture, turgor normal. No rashes or lesions No abnormal inguinal nodes palpated Neurologic: Grossly normal  Pelvic: External genitalia:  no lesions              Urethra:  normal appearing urethra with no masses, tenderness or lesions              Bartholins and Skenes: normal                 Vagina: normal appearing vagina with normal color and discharge, no lesions. Second degree cystocele, mild uterine prolapse, and first degree rectocele. .               Cervix: no lesions                Bimanual Exam:  Uterus:  normal size, contour, position, consistency, mobility, non-tender              Adnexa: no mass, fullness, tenderness              Rectal exam: Yes.  .  Confirms.              Anus:  normal sphincter tone, no  lesions  Chaperone was present for exam.  ASSESSMENT  Incomplete uterovaginal prolapse and mixed incontinence.  Hx TIAs and no stroke. Residual weakness in left arm.  Not estrogen candidate. Hx elevated hemoglobin A1C in prediabetic range. Sleep apnea.  Uses CPAP machine.  Hx umbilical hernia repair 38 years ago.  Does not know if she had mesh.   PLAN  Surgical care including LAVH/BSO/anterior and posterior colporrhaphy, TVT Exact midurethral sling and cystoscopy discussed. Risks/benefits, and alternatives reviewed.  Patient wishes to proceed.  Discussed rare possibility of conversion to laparotomy.  She will bring her CPAP machine to the hospital.  Enema the night before surgery.  She will see her PCP tomorrow.    An After Visit Summary was printed and given to the patient.  _25_____ minutes face to face time of which over 50% was spent in counseling.

## 2015-09-29 NOTE — Anesthesia Procedure Notes (Signed)
Procedure Name: Intubation Date/Time: 09/29/2015 7:41 AM Performed by: Flossie Dibble Pre-anesthesia Checklist: Patient identified, Timeout performed, Emergency Drugs available, Suction available and Patient being monitored Patient Re-evaluated:Patient Re-evaluated prior to inductionOxygen Delivery Method: Circle system utilized Preoxygenation: Pre-oxygenation with 100% oxygen Intubation Type: IV induction Ventilation: Mask ventilation without difficulty and Oral airway inserted - appropriate to patient size Laryngoscope Size: Mac and 3 Grade View: Grade I Tube type: Oral Tube size: 7.0 mm Number of attempts: 1 Airway Equipment and Method: Patient positioned with wedge pillow and Stylet Placement Confirmation: ETT inserted through vocal cords under direct vision,  positive ETCO2 and breath sounds checked- equal and bilateral Secured at: 20 cm Tube secured with: Tape Dental Injury: Teeth and Oropharynx as per pre-operative assessment

## 2015-09-29 NOTE — Op Note (Signed)
OPERATIVE REPORT   PREOPERATIVE DIAGNOSIS:   Incomplete uterovaginal prolapse, genuine stress incontinence.  POSTOPERATIVE DIAGNOSIS:   Incomplete uterovaginal prolapse, genuine stress incontinence.  PROCEDURES: Total laparoscopic hysterectomy with bilateral salpingo-oophorectomy, anterior and posterior colporrhaphy, TVT Exact midurethral sling, cystoscopy  SURGEON: Lenard Galloway, M.D.  ASSISTANT: Lyman Speller, M.D.  ANESTHESIA: General endotracheal, local to skin with 0.25% Marcaine, local to vaginal mucosa with 1% lidocaine with epinephrine 1:100,000.  IVF:   1500 cc LR.  ESTIMATED BLOOD LOSS:  170 cc.  URINE OUTPUT:  150  cc.  COMPLICATIONS: None.  INDICATIONS FOR THE PROCEDURE:    The patient is a 60 year old P2 female who presents with mixed incontinence and a second degree cystocele.  The patient is experiencing difficulty completing bowel movements and she does regular splinting.  She complete urodynamic testing confirming the mixed incontinence and she has been treated with Ditropan XL which has improved her overactive bladder.  She now wishes to proceed with surgical correction of her incomplete uterovaginal prolapse and stress incontinence.    A plan is made to proceed with a total laparoscopic hysterectomy with bilateral salpingo--oophorectomy,anterior and posterior colporrhaphy, TVT Exact midurethral sling, and cystoscopy after risks, benefits, and alternatives are reviewed.  FINDINGS:    Exam under anesthesia revealed a second degree cystocele, minimal uterine prolapse and a first degree rectocele.   Laparoscopy revealed a normal uterus, bilateral tubes and ovaries.  The upper abdomen was normal and demonstrated a normal liver.  There was no adhesive disease or endometriosis were seen in the abdomen or pelvis.  Cystoscopy at the termination of the procedure showed the bladder to be normal throughout 360 degrees including the bladder  dome and trigone. There was no evidence of any foreign body in the bladder or the urethra. There was no evidence of any lesions  of the bladder or the urethra.  Both of the ureters were noted to be patent bilaterally.  SPECIMENS:    The uterus, cervix, and bilateral tubes and ovaries were went to pathology.   DESCRIPTION OF PROCEDURE:   The patient was reidentified in the preoperative hold area.  She did receive  Cefotetan IV for antibiotic prophylaxis. She received TED hose and PAS stockings for DVT prophylaxis.  In the operating room, the patient was placed in the dorsal lithotomy position on the operating room table. The anti-slip pad was used under the patient. Her legs were placed in the Tsaile stirrups and her arms were both tucked at her sides. The patient received general endotracheal anesthesia. The abdomen and vagina were then sterilely prepped and she was sterilely draped.  A speculum was placed in the vagina and a single-tooth tenaculum was placed on the anterior cervical lip. A figure-of-eight suture of 0 Vicryl was placed on each the anterior and the posterior cervical lips. The uterus was sounded to __ 8___ cm. The cervix was then dilated with [facility] dilators. A #  __8___ RUMI tip with a KOH ring was then placed through the cervix and into the uterine cavity without difficulty. The remaining vaginal instruments were then removed. A Foley catheter was placed inside the bladder.  Attention was turned to the abdomen where the umbilical region was injected with 0.25% Marcaine and a small incision created.  A Veress needle was then used to insufflate the abdomen with CO2 gas after a saline drop test was performed and the fluid flowed freely.  A 5 mm umbilical incision was created with a scalpel after the skin.  A 5 mm camera port was then placed using the Optiview. 5 mm incisions were then created in the left upper abdomen and the left lower abdomen after the skin was  injected locally with 0.25% Marcaine.  The 5 mm trocars were then placed under visualization of the laparoscope.  An 8 mm trocar was placed in the right lower quadrant after injecting with Marcaine and incising with a scalpel.   At this time, the patient was placed in Trendelenburg position. An inspection of the abdomen and pelvis was performed. The findings are as noted above.    At this time, the bilateral ureters were identified.  The left infundibulopelvic ligament was grasped and the Ligasure was used to cauterize and cut through the pedicle. The left broad ligament was similarly cauterized and cut with the Ligasure. The left round ligament was then cauterized and divided with same instrument. Dissection was performed to the anterior and posterior leaves of the broad ligaments using the monopolar laparoscopic scissors. The incision was carried across the anterior cul- de-sac along the vesicouterine fold and the bladder was dissected away from the cervix using monopolar cautery scissors. The peritoneum was taken down posteriorly. The left uterine artery was skeletonized at this time using sharp dissection and monopolar cautery.   It was then cauterized and cut with the Ligasure instrument.  Attention was turned to the patient's right-hand side at this time. The same procedure that was performed on the left side was repeated on the right side with respect to the isolation, cautery, and transection of the vessels and the bladder flap dissection.     The KOH ring was nicely visible. The colpotomy incision was performed with the laparoscopic monopolar scissors in a circumferential fashion. The specimen was then removed from the peritoneal cavity and was sent to Pathology. The balloon occluder was placed in the vagina. The vaginal cuff was sutured at this time using a running suture of 0 V-Loc. The vagina was closed from the patient's right hand side to the left hand side and then back 2 sutures  towards the midline. This provided good full-thickness closure of the vaginal cuff.  The laparoscopic needle for suturing was removed from the peritoneal cavity.  The pelvis was irrigated and suctioned.  The pneumoperitoneal was let down.  There was good hemostasis of the operative sites and pedicles.  The left sided laparoscopic trocars were removed under visualization of the laparoscope. The CO2 pneumoperitoneum was released, and then the right sided 8 mm trocar was removed.   The umbilical trocar was removed.   The patient's Foley catheter was removed at this time and cystoscopy was performed and the findings are as noted above. The Foley catheter was then replaced and left to gravity drainage.  Final inspection of the vagina demonstrated good hemostasis of the vaginal cuff.  All skin incisions were closed with subcuticular sutures of 4-0 Vicryl. Dermabond was placed over the incisions.  The anterior and posterior colporrhaphy and the TVT Exact sling were performed next.  Allis clamps were used to mark the anterior vaginal wall from 1 cm below the urethra to the vaginal apex.  The anterior vaginal wall mucosa was injected locally with 1% lidocaine with epinephrine, 1:100,000.  The vaginal mucosa was then incised vertically in the midline with the scalpel.  With a combination of sharp and blunt dissection, the subvaginal tissue was dissected off the bladder bilaterally.  Hemostasis was created with figure of 8 vicryl suture over the bladder at the base  of the dissection. The dissection was carried back to the pubic rami anteriorly. The anterior colporrhaphy was performed with vertical mattress sutures of 0 Vicryl for reduction of the cystocele.  The TVT Exact midurethral sling was performed.  The 1 cm suprapubic incisions were created with a scalpel to the right and left of the midline.  The TVT Exact was performed in a bottom-up fashion.  The Foley catheter was removed and the Foley tip  with the obturator guide was placed inside the urethra and deflected properly.  The guide was placed through the right retropubic space and then up through the right suprapubic incision.  This was performed without difficulty.  The urethra was deflected in opposite direction and the same was then performed on the patient's left-hand side.  The obturator guide was removed and cystoscopy was performed and the findings were as noted above.  All cystoscopic fluid was drained and the Foley catheter was replaced. The sling was brought up through the suprapubic incisions bilaterally. A Kelly clamp was placed between the sling and the urethra, and the plastic sheaths were removed.  The sling was trimmed suprapubically. The sling was noted to be in good position.   Surgiflo was then placed over the anterior colporrhaphy repair.  Hemostasis was good.  The anterior vaginal wall mucosa was trimmed and then the anterior vaginal wall was closed with a running locked suture of 2-0 Vicryl.  The posterior colporrhaphy was performed at this time.  Allis clamps were used to mark the perineal body and then the posterior vaginal mucosa up to the level of the vaginal apex.  The perineal body and mucosa were injected locally with 1% lidocaine with epinephrine, 1:100,000.  A triangular wedge of epithelium was excised from the perineal body and the posterior mucosa was incised vertically in the midline with a Metzenbaum scissors.  Sharp and blunt dissection were used to dissect the perirectal fascia off the vaginal mucosa bilaterally.  The rectocele was reduced at this time.    Vertical mattress sutures of 0 Vicryl were then used.  The rectocele reduced nicely.  Excess vaginal mucosa was trimmed at this time and the posterior vaginal wall was closed with a running lock suture of 2-0 Vicryl down to the hymen.  This suture was then brought behind the hymen.  The 2-0 Vicryl was used to then place running sutures along the transverse  superficial perineal muscles.  This suture was then brought up the perineum in a subcuticular fashion and the knot was tied at the hymen as for an episiotomy-type repair.  Rectal exam was performed and there was no evidence of any suture or foreign body in the rectum.  There was good support and elevation to the anterior apical and posterior vaginal walls.  A gauze packing was placed inside the vagina.  The suprapubic incisions were closed with Dermabond.  The patient was awakened and extubated, and escorted to the recovery room in stable condition.  There were no complications.  All needle, instrument, and sponge counts were correct.  Lenard Galloway, M.D.

## 2015-09-29 NOTE — Anesthesia Postprocedure Evaluation (Signed)
Anesthesia Post Note  Patient: Casey Chen  Procedure(s) Performed: Procedure(s) (LRB): ANTERIOR (CYSTOCELE) AND POSTERIOR REPAIR (RECTOCELE) (N/A) TRANSVAGINAL TAPE (TVT) PROCEDURE exact mid-urethral sling (N/A) CYSTOSCOPY (N/A) HYSTERECTOMY TOTAL LAPAROSCOPIC (N/A) SALPINGO OOPHORECTOMY (Bilateral)  Patient location during evaluation: [facility] Anesthesia Type: General Level of consciousness: awake and alert Pain management: pain level controlled Vital Signs Assessment: post-procedure vital signs reviewed and stable Respiratory status: spontaneous breathing, nonlabored ventilation and patient connected to nasal cannula oxygen Cardiovascular status: stable Postop Assessment: adequate PO intake Anesthetic complications: no     Last Vitals:  Vitals:   09/29/15 1514 09/29/15 1735  BP: 139/81 139/76  Pulse: 87 93  Resp: 16 16  Temp: 37 C 36.8 C    Last Pain:  Vitals:   09/29/15 1735  TempSrc: Oral  PainSc:    Pain Goal: Patients Stated Pain Goal: 3 (09/29/15 1415)               Lian Tanori Hristova

## 2015-09-29 NOTE — Transfer of Care (Signed)
Immediate Anesthesia Transfer of Care Note  Patient: Casey Chen  Procedure(s) Performed: Procedure(s): ANTERIOR (CYSTOCELE) AND POSTERIOR REPAIR (RECTOCELE) (N/A) TRANSVAGINAL TAPE (TVT) PROCEDURE exact mid-urethral sling (N/A) CYSTOSCOPY (N/A) HYSTERECTOMY TOTAL LAPAROSCOPIC (N/A) SALPINGO OOPHORECTOMY (Bilateral)  Patient Location: PACU  Anesthesia Type:General  Level of Consciousness: awake, alert  and oriented  Airway & Oxygen Therapy: Patient Spontanous Breathing and Patient connected to nasal cannula oxygen  Post-op Assessment: Report given to RN and Post -op Vital signs reviewed and stable  Post vital signs: Reviewed and stable  Last Vitals:  Vitals:   09/29/15 0601  BP: (!) 141/84  Pulse: 72  Resp: 16  Temp: 36.9 C    Last Pain:  Vitals:   09/29/15 0601  TempSrc: Oral      Patients Stated Pain Goal: 5 ([id number])  Complications: No apparent anesthesia complications

## 2015-09-30 ENCOUNTER — Encounter (HOSPITAL_COMMUNITY): Payer: Self-pay | Admitting: Obstetrics and Gynecology

## 2015-09-30 DIAGNOSIS — Z90710 Acquired absence of both cervix and uterus: Secondary | ICD-10-CM | POA: Diagnosis not present

## 2015-09-30 LAB — COMPREHENSIVE METABOLIC PANEL
ALK PHOS: 65 U/L (ref 38–126)
ALT: 14 U/L (ref 14–54)
AST: 17 U/L (ref 15–41)
Albumin: 3.3 g/dL — ABNORMAL LOW (ref 3.5–5.0)
Anion gap: 5 (ref 5–15)
BUN: 10 mg/dL (ref 6–20)
CALCIUM: 8.5 mg/dL — AB (ref 8.9–10.3)
CHLORIDE: 106 mmol/L (ref 101–111)
CO2: 28 mmol/L (ref 22–32)
CREATININE: 0.88 mg/dL (ref 0.44–1.00)
GFR calc non Af Amer: >60 mL/min (ref >60)
GLUCOSE: 113 mg/dL — AB (ref 65–99)
Potassium: 3.4 mmol/L — ABNORMAL LOW (ref 3.5–5.1)
Sodium: 139 mmol/L (ref 135–145)
Total Bilirubin: 0.7 mg/dL (ref 0.3–1.2)
Total Protein: 6.5 g/dL (ref 6.5–8.1)

## 2015-09-30 LAB — CBC
HEMATOCRIT: 35 % — AB (ref 36.0–46.0)
Hemoglobin: 12 g/dL (ref 12.0–15.0)
MCH: 30.1 pg (ref 26.0–34.0)
MCHC: 34.3 g/dL (ref 30.0–36.0)
MCV: 87.7 fL (ref 78.0–100.0)
Platelets: 254 10*3/uL (ref 150–400)
RBC: 3.99 MIL/uL (ref 3.87–5.11)
RDW: 13.9 % (ref 11.5–15.5)
WBC: 14.3 10*3/uL — ABNORMAL HIGH (ref 4.0–10.5)

## 2015-09-30 MED ORDER — IBUPROFEN 600 MG PO TABS: 600.0000 mg | ORAL_TABLET | Freq: Four times a day (QID) | ORAL | 0 refills | Status: DC | PRN

## 2015-09-30 MED ORDER — OXYCODONE-ACETAMINOPHEN 5-325 MG PO TABS: 1.0000 | ORAL_TABLET | ORAL | 0 refills | Status: DC | PRN

## 2015-09-30 NOTE — Progress Notes (Signed)
1 Day Post-Op Procedure(s) (LRB): ANTERIOR (CYSTOCELE) AND POSTERIOR REPAIR (RECTOCELE) (N/A) TRANSVAGINAL TAPE (TVT) PROCEDURE exact mid-urethral sling (N/A) CYSTOSCOPY (N/A) HYSTERECTOMY TOTAL LAPAROSCOPIC (N/A) SALPINGO OOPHORECTOMY (Bilateral)  Subjective: Patient reports + flatus.   Ambulated.  Foley out and packing out.  No void yet.  Nausea once last hs.  Not using morphine.  Is taking a regular diet.   Objective: I have reviewed patient's vital signs, intake and output and labs. Vitals:   09/30/15 0551 09/30/15 0602  BP:  (!) 160/73  Pulse:  77  Resp: 18 (!) 21  Temp:  98.5 F (36.9 C)  I/O - 4997 cc/3100 cc.  WBC 14.3, and Hgb 12  General: alert and cooperative Resp: clear to auscultation bilaterally Cardio: regular rate and rhythm, S1, S2 normal, no murmur, click, rub or gallop GI: soft, non-tender; bowel sounds normal; no masses,  no organomegaly and incision: clean, dry and intact Vaginal Bleeding: minimal  Assessment: s/p Procedure(s): ANTERIOR (CYSTOCELE) AND POSTERIOR REPAIR (RECTOCELE) (N/A) TRANSVAGINAL TAPE (TVT) PROCEDURE exact mid-urethral sling (N/A) CYSTOSCOPY (N/A) HYSTERECTOMY TOTAL LAPAROSCOPIC (N/A) SALPINGO OOPHORECTOMY (Bilateral): progressing well  Plan: Encourage ambulation Advance to PO medication Discontinue IV fluids Discharge home  Needs to complete voiding trials to determine if needs a foley cath at discharge.  Rx for Percocet and Motrin.  Follow up in 5 days in office.  Instructions given in verbal and written form.   LOS: 0 days    Brook Matthew Saras 09/30/2015, 7:51 AM

## 2015-09-30 NOTE — Progress Notes (Signed)
Vaginal packing removed as ordered with moderate amount dark bloody drainage noted on packing.  Foley cath discontinued as ordered also, patient tolerated well.

## 2015-09-30 NOTE — Progress Notes (Signed)
Pt. Is discharged in the care of husband,with  N.T. Escort. Denies any pain or discomfort. Understands all discharged instructions well. Questions asked and answered No equipment needed for home use. Voiding with out difficulty... Marland Kitchen

## 2015-09-30 NOTE — Discharge Instructions (Signed)
Total Laparoscopic Hysterectomy, Care After Refer to this sheet in the next few weeks. These instructions provide you with information on caring for yourself after your procedure. Your health care provider may also give you more specific instructions. Your treatment has been planned according to current medical practices, but problems sometimes occur. Call your health care provider if you have any problems or questions after your procedure. WHAT TO EXPECT AFTER THE PROCEDURE  Pain and bruising at the incision sites. You will be given pain medicine to control it.  Menopausal symptoms such as hot flashes, night sweats, and insomnia if your ovaries were removed.  Sore throat from the breathing tube that was inserted during surgery. HOME CARE INSTRUCTIONS  Only take over-the-counter or prescription medicines for pain, discomfort, or fever as directed by your health care provider.   Do not take aspirin. It can cause bleeding.   Do not drive when taking pain medicine.   Follow your health care provider's advice regarding diet, exercise, lifting, driving, and general activities.   Resume your usual diet as directed and allowed.   Get plenty of rest and sleep.   Do not douche, use tampons, or have sexual intercourse for at least 6 weeks, or until your health care provider gives you permission.   Change your bandages (dressings) as directed by your health care provider.   Monitor your temperature and notify your health care provider of a fever.   Take showers instead of baths for 2-3 weeks.   Do not drink alcohol until your health care provider gives you permission.   If you develop constipation, you may take a mild laxative with your health care provider's permission. Bran foods may help with constipation problems. Drinking enough fluids to keep your urine clear or pale yellow may help as well.   Try to have someone home with you for 1-2 weeks to help around the house.    Keep all of your follow-up appointments as directed by your health care provider.  SEEK MEDICAL CARE IF:  You have swelling, redness, or increasing pain around your incision sites.   You have pus coming from your incision.   You notice a bad smell coming from your incision.   Your incision breaks open.   You feel dizzy or lightheaded.   You have pain or bleeding when you urinate.   You have persistent diarrhea.   You have persistent nausea and vomiting.   You have abnormal vaginal discharge.   You have a rash.   You have any type of abnormal reaction or develop an allergy to your medicine.   You have poor pain control with your prescribed medicine.  SEEK IMMEDIATE MEDICAL CARE IF:  You have chest pain or shortness of breath.  You have severe abdominal pain that is not relieved with pain medicine.  You have pain or swelling in your legs. MAKE SURE YOU:  Understand these instructions.  Will watch your condition.  Will get help right away if you are not doing well or get worse.   This information is not intended to replace advice given to you by your health care provider. Make sure you discuss any questions you have with your health care provider.   Document Released: 10/17/2012 Document Revised: 01/01/2013 Document Reviewed: 10/17/2012 Elsevier Interactive Patient Education 2016 [employer]  Urethral Vaginal Sling, Care After Refer to this sheet in the next few weeks. These instructions provide you with information on caring for yourself after your procedure. Your health care  provider may also give you more specific instructions. Your treatment has been planned according to current medical practices, but problems sometimes occur. Call your health care provider if you have any problems or questions after your procedure.  WHAT TO EXPECT AFTER THE PROCEDURE  After your procedure, it is typical to have the following:  A catheter in your bladder until  your bladder is able to work on its own properly. You will be instructed on how to empty the catheter bag.  Absorbable stitches in your incisions. They will slowly dissolve over 1-2 months. HOME CARE INSTRUCTIONS  Get plenty of rest.  Only take over-the-counter or prescription medicines as directed by your health care provider. Do not take aspirin because it can cause bleeding.  Do not take baths. Take showers until your health care provider tells you otherwise.  You may resume your usual diet. Eat a well-balanced diet.  Drink enough fluids to keep your urine clear or pale yellow.  Limit exercise and activities as directed by your health care provider. Do not lift anything heavier than 5 pounds (2.3 kg).  Do not douche, use tampons, or have sexual intercourse for 6 weeks after your procedure.  Follow up with your health care provider as directed. SEEK MEDICAL CARE IF:  You have a heavy or bad smelling vaginal discharge.   You have a rash.   You have pain that is not controlled with medicines.   You have lightheadedness or feel faint.  SEEK IMMEDIATE MEDICAL CARE IF:  You have a fever.   You have vaginal bleeding.   You faint.   You have shortness of breath.   You have chest, abdominal, or leg pain.   You have pain when urinating or cannot urinate.   Your catheter is still in your bladder and becomes blocked.   You have swelling, redness, and pain in the vaginal area.    This information is not intended to replace advice given to you by your health care provider. Make sure you discuss any questions you have with your health care provider.   Document Released: 10/17/2012 Document Reviewed: 10/17/2012 Elsevier Interactive Patient Education 2016 [employer]  Anterior and Posterior Colporrhaphy, Sling Procedure, Care After Refer to this sheet in the next few weeks. These instructions provide you with information on caring for yourself after your  procedure. Your health care provider may also give you more specific instructions. Your treatment has been planned according to current medical practices, but problems sometimes occur. Call your health care provider if you have any problems or questions after your procedure.  HOME CARE INSTRUCTIONS  Rest as much as possible during the first 2 weeks after the procedure.   Avoid heavy lifting (more than 10 pounds [4.5 kg]), pushing, or pulling. Limit stair climbing to once or twice a day the first week, then slowly increase this activity.   Avoid standing for prolonged periods of time.   Talk with your health care provider about when you may resume your usual physical activity.   You may resume your normal diet right away.   Drink at least 6-8 glasses of non-caffeinated beverages per day.   Eat a well-balanced diet. Daily portions of food from the meat (protein), milk, fruit, vegetable, and bread families are necessary for your health.   Your normal bowel function should return. If you become constipated, you may:   Take a mild laxative.  Add fruit and bran to your diet.  Drink more liquids.  You may  take a shower and wash your hair.   Only take over-the-counter or prescription medicines as directed by your health care provider.   Clean the incision with water. Do not use a dressing unless the incision is draining or irritated. Check your incision daily for redness, draining, swelling, or separation of the skin.   Follow any bladder care instructions provided by your health care provider.   Keep your perineal area (the area between vagina and rectum) clean and dry. Perform perineal care after every bowel movement and each time you urinate. You may take a sitz bath or sit in a tub of clean, warm water when necessary, unless your health care provider tells you otherwise.   Do not have sexual intercourse until permitted by your health care provider.   Follow up with your  health care provider as directed.  SEEK MEDICAL CARE IF:  You have shaking chills.   Your pain is not relieved with medicine or becomes worse.  You have frequent or urgent urination, or you are unable to completely empty your bladder.   You feel a burning sensation when urinating.   You see pus coming from the wounds.  SEEK IMMEDIATE MEDICAL CARE IF:  You develop a fever.  You notice redness, drainage, swelling, or separation of the skin at the incision site.  You have difficulty breathing.  You are unable to urinate. MAKE SURE YOU:   Understand these instructions.  Will watch your condition.  Will get help right away if you are not doing well or get worse.   This information is not intended to replace advice given to you by your health care provider. Make sure you discuss any questions you have with your health care provider.   Document Released: 08/11/2003 Document Revised: 08/29/2012 Document Reviewed: 05/18/2012 Elsevier Interactive Patient Education [employer].

## 2015-10-04 NOTE — Discharge Summary (Signed)
Physician Discharge Summary  Patient ID: Casey Chen MRN: [id number] DOB/AGE: 60/25/1957 60 y.o.  Admit date: 09/29/2015 Discharge date:  09/30/15  Admission Diagnoses:  Incomplete uterovaginal prolapse, genuine stress incontinence.  Discharge Diagnoses:    1.  Incomplete uterovaginal prolapse, genuine stress incontinence. 2.  Status post total laparoscopic hysterectomy with bilateral salpingo-oophorectomy, TVT exact midurethral sling, anterior and posterior colporrhaphy.   Active Problems:   Status post laparoscopic hysterectomy   Discharged Condition: good  Hospital Course:  The patient was admitted on 09/29/15 for a total laparoscopic hysterectomy with bilateral salpingo-oophorectomy, TVT exact midurethral sling, anterior and posterior colporrhaphy, which were performed without complication while under general anesthesia.  The patient's post op course was uneventful.  She had a morphine PCA and Toradol for pain control initially, and this was converted over to Percocet and Motrin on post op day one when the patient began taking po well.  She ambulated independently and wore PAS and Ted hose for DVT prophylaxis while in bed.  Her foley catheter were removed on post op day one, and she voided good volumes. Her void were 100 - 300 cc, and her post void residuals by bladder ultrasound were 25 - 59 cc.  The patient's vital signs remained stable and she demonstrated no signs of infection during her hospitalization.  The patient's post op day one Hgb was 12.  She was tolerating the this well.  She had very minimal vaginal bleeding, and her incision(s) demonstrated no signs of erythema or significant drainage.  She was found to be in good condition and ready for discharge on post op day one.  Consults: None  Significant Diagnostic Studies: labs:  See Hospital Course.  Treatments: surgery:  total laparoscopic hysterectomy with bilateral salpingo-oophorectomy, TVT exact midurethral sling, anterior  and posterior colporrhaphy performed on 09/29/15.  Discharge Exam: Blood pressure (!) 141/58, pulse 80, temperature 98.8 F (37.1 C), temperature source Oral, resp. rate 20, height 5\' 7"  (1.702 m), weight 218 lb 4 oz (99 kg), last menstrual period 01/11/2007, SpO2 95 %. General: alert and cooperative Resp: clear to auscultation bilaterally Cardio: regular rate and rhythm, S1, S2 normal, no murmur, click, rub or gallop GI: soft, non-tender; bowel sounds normal; no masses,  no organomegaly and incision: clean, dry and intact Vaginal Bleeding: minimal  Disposition: 01-Home or Self Care Instructions were reviewed in verbal and written form.     Medication List    STOP taking these medications   oxybutynin 10 MG 24 hr tablet Commonly known as:  DITROPAN XL   traMADol 50 MG tablet Commonly known as:  ULTRAM     TAKE these medications   aspirin 325 MG tablet Take 325 mg by mouth daily.   cetirizine 10 MG tablet Commonly known as:  ZYRTEC Take 10 mg by mouth daily.   FLUoxetine 20 MG capsule Commonly known as:  PROZAC Take 40 mg by mouth daily.   ibuprofen 600 MG tablet Commonly known as:  ADVIL,MOTRIN Take 1 tablet (600 mg total) by mouth every 6 (six) hours as needed (mild pain).   Melatonin 2.5 MG Caps Take 2.5 mg by mouth at bedtime as needed (sleep).   omeprazole-sodium bicarbonate 40-1100 MG capsule Commonly known as:  ZEGERID Take 1 capsule by mouth daily.   oxyCODONE-acetaminophen 5-325 MG tablet Commonly known as:  PERCOCET/ROXICET Take 1-2 tablets by mouth every 4 (four) hours as needed for severe pain (moderate to severe pain (when tolerating fluids)).   rosuvastatin 40 MG tablet Commonly known  as:  CRESTOR Take 1 tablet by mouth daily.   verapamil 240 MG CR tablet Commonly known as:  CALAN-SR Take 240 mg by mouth at bedtime.      Follow-up Information    Arloa Koh, MD In 5 days.   Specialty:  Obstetrics and Gynecology Contact information: [address] [place] [address] [phone]           Signed: Arloa Koh 10/04/2015, 7:14 PM

## 2015-10-05 ENCOUNTER — Ambulatory Visit (INDEPENDENT_AMBULATORY_CARE_PROVIDER_SITE_OTHER): Payer: Managed Care, Other (non HMO) | Admitting: Obstetrics and Gynecology

## 2015-10-05 ENCOUNTER — Encounter: Payer: Self-pay | Admitting: Obstetrics and Gynecology

## 2015-10-05 VITALS — BP 134/84 | HR 72 | Temp 98.3°F | Ht 67.0 in | Wt 217.0 lb

## 2015-10-05 DIAGNOSIS — Z9889 Other specified postprocedural states: Secondary | ICD-10-CM

## 2015-10-05 NOTE — Progress Notes (Signed)
GYNECOLOGY  VISIT   HPI: 60 y.o.   Married  Caucasian  female   [phone] with Patient's last menstrual period was 01/11/2007.   here for post op for  ANTERIOR (CYSTOCELE) AND POSTERIOR REPAIR (RECTOCELE)   TRANSVAGINAL TAPE (TVT) PROCEDURE   CYSTOSCOPY   HYSTERECTOMY TOTAL LAPAROSCOPIC   SALPINGO OOPHORECTOMY    Voiding well at home.  Taking only ibuprofen for pain.  Eating well.  BM returned.  Used stool softeners for 3 days.  Bleeding is almost done.   Final pathology showed slight atypia of the cervix, normal uterus - tubes, and ovaries.   Not using Ditropan XL.   GYNECOLOGIC HISTORY: Patient's last menstrual period was 01/11/2007. Contraception:  hysterectomy Menopausal hormone therapy:  none Last mammogram:  07-29-15 3D/Density B/oval mass upper outer quad.Lt.breast--U/S reveals 38mm duct ectasia of Lt.breast probably benign. Rec.f/u Lt.mmg and U/S in 35mo/BiRads3:[facility] Last pap smear:   05-05-15 neg HPV HR neg        OB History    Gravida Para Term Preterm AB Living   3 2 2   1 2    SAB TAB Ectopic Multiple Live Births   1       2         Patient Active Problem List   Diagnosis Date Noted  . Status post laparoscopic hysterectomy 09/29/2015  . 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 Depression 03/19/2013  . Other and unspecified hyperlipidemia 03/19/2013  . Postmenopausal 03/19/2013  . TIA (transient ischemic attack) 03/19/2013  . HTN (hypertension) 11/02/2012  . Numbness 09/20/2012  . Benign neoplasm of colon 06/08/2010  . Esophageal reflux 04/19/2010  . Dysphagia, unspecified(787.20) 04/19/2010    Past Medical History:  Diagnosis Date  . Arthritis    right hip  . Depression   . Esophageal stricture 2011  . GERD (gastroesophageal reflux disease)   . Glucose intolerance (impaired glucose tolerance) 04/2015  . Head ache   . Hyperlipidemia   . Hypertension   . Left-sided weakness    due to TIA  . Pinched nerve in shoulder    left  .  Sleep apnea   . Stroke ([place]) 2013   TIA on OCP, NO ESTROGEN.  recurrent TIA 08/2012  . Weakness    left arm past TIA    Past Surgical History:  Procedure Laterality Date  . ANTERIOR AND POSTERIOR REPAIR N/A 09/29/2015   Procedure: ANTERIOR (CYSTOCELE) AND POSTERIOR REPAIR (RECTOCELE);  Surgeon: Nunzio Cobbs, MD;  Location[facility];  Service: Gynecology;  Laterality: N/A;  . BLADDER SUSPENSION N/A 09/29/2015   Procedure: TRANSVAGINAL TAPE (TVT) PROCEDURE exact mid-urethral sling;  Surgeon: Nunzio Cobbs, MD;  Location: [facility];  Service: Gynecology;  Laterality: N/A;  . CYSTOSCOPY N/A 09/29/2015   Procedure: CYSTOSCOPY;  Surgeon: Nunzio Cobbs, MD;  Location: [facility];  Service: Gynecology;  Laterality: N/A;  . ESOPHAGOGASTRODUODENOSCOPY    . LAPAROSCOPIC HYSTERECTOMY N/A 09/29/2015   Procedure: HYSTERECTOMY TOTAL LAPAROSCOPIC;  Surgeon: Nunzio Cobbs, MD;  Location: [facility];  Service: Gynecology;  Laterality: N/A;  . SALPINGOOPHORECTOMY Bilateral 09/29/2015   Procedure: SALPINGO OOPHORECTOMY;  Surgeon: Nunzio Cobbs, MD;  Location: [facility];  Service: Gynecology;  Laterality: Bilateral;  . TUBAL LIGATION  1979  . UMBILICAL HERNIA REPAIR  1979    Current Outpatient Prescriptions  Medication Sig Dispense Refill  . aspirin 325 MG tablet Take 325 mg by mouth daily.    Marland Kitchen  cetirizine (ZYRTEC) 10 MG tablet Take 10 mg by mouth daily.    Marland Kitchen FLUoxetine (PROZAC) 20 MG capsule Take 40 mg by mouth daily.     Marland Kitchen ibuprofen (ADVIL,MOTRIN) 600 MG tablet Take 1 tablet (600 mg total) by mouth every 6 (six) hours as needed (mild pain). 30 tablet 0  . Melatonin 2.5 MG CAPS Take 2.5 mg by mouth at bedtime as needed (sleep).    Marland Kitchen omeprazole-sodium bicarbonate (ZEGERID) 40-1100 MG per capsule Take 1 capsule by mouth daily.    Marland Kitchen oxyCODONE-acetaminophen (PERCOCET/ROXICET) 5-325 MG tablet Take 1-2 tablets by mouth every 4 (four) hours as needed for severe pain (moderate  to severe pain (when tolerating fluids)). 30 tablet 0  . rosuvastatin (CRESTOR) 40 MG tablet Take 1 tablet by mouth daily.    . verapamil (CALAN-SR) 240 MG CR tablet Take 240 mg by mouth at bedtime.     No current facility-administered medications for this visit.      ALLERGIES: Review of patient's allergies indicates no known allergies.  Family History  Problem Relation Age of Onset  . Colon polyps Mother     father  . Irritable bowel syndrome Mother   . Lung cancer Mother   . Multiple births Mother     one set of twins  . Depression Mother   . Brain cancer Brother   . Diabetes Sister   . Heart disease Father   . Colon polyps Father   . Colon cancer Neg Hx     Social History   Social History  . Marital status: Married    Spouse name: N/A  . Number of children: 2  . Years of education: N/A   Occupational History  . bookkeeper    Social History Main Topics  . Smoking status: Former Smoker    Packs/day: 1.00    Years: 13.00    Types: Cigarettes    Quit date: 01/10/1990  . Smokeless tobacco: Never Used  . Alcohol use 0.6 oz/week    1 Glasses of wine per week  . Drug use: No  . Sexual activity: No     Comment: btl   Other Topics Concern  . Not on file   Social History Narrative  . No narrative on file    ROS:  Pertinent items are noted in HPI.  PHYSICAL EXAMINATION:    LMP 01/11/2007 Comment: on Depo X 4 yrs prior    General appearance: alert, cooperative and appears stated age   Abdomen: incisions intact, soft, non-tender, no masses,  no organomegaly   Pelvic: External genitalia:  SP incisions intact.  Ecchymoses bilaterally.  No induration.                Urethra:  normal appearing urethra with no masses, tenderness or lesions              Bartholins and Skenes: normal                 Vagina: normal appearing vagina with normal color and discharge, no lesions.  Good vaginal support.  Midurethral sling protected.               Cervix:  absent                 Bimanual Exam:  Uterus:  absent              Adnexa: no mass, fullness, tenderness    Chaperone was present for exam.  ASSESSMENT  Status post  total laparoscopic hysterectomy with bilateral salpingo-oophorectomy, cystoscopy, anterior and posterior colporrhaphy, TVT Exact midurethral sling.  Doing well post op. Final pathology showing atypia of the cervix.  No dysplasia or malignancy seen.   PLAN  Surgical findings, procedure, and pathology report reviewed.  Questions answered.  Does not need a follow up pap.  Do not resume Ditropan XL.   Please call if urgency recurs.  Continue decreased activity.  Follow up for 6 week post op check.  An After Visit Summary was printed and given to the patient.

## 2015-10-07 ENCOUNTER — Telehealth: Payer: Self-pay | Admitting: Obstetrics and Gynecology

## 2015-10-07 NOTE — Telephone Encounter (Signed)
Called patient to advised [facility] forms have been completed.  Patient requested I fax forms to her employer, [employer] at fax number [phone] and mail original to her home address on file.  Form has been faxed and confirmation received. Original form mailed to patient, as requested. Ok to close encounter

## 2015-10-21 ENCOUNTER — Telehealth: Payer: Self-pay | Admitting: Obstetrics and Gynecology

## 2015-10-21 NOTE — Telephone Encounter (Signed)
MESSAGE NOT NEEDED

## 2015-11-09 ENCOUNTER — Ambulatory Visit (INDEPENDENT_AMBULATORY_CARE_PROVIDER_SITE_OTHER): Payer: Managed Care, Other (non HMO) | Admitting: Obstetrics and Gynecology

## 2015-11-09 ENCOUNTER — Encounter: Payer: Self-pay | Admitting: Obstetrics and Gynecology

## 2015-11-09 VITALS — BP 134/80 | HR 84 | Ht 67.0 in

## 2015-11-09 DIAGNOSIS — N3281 Overactive bladder: Secondary | ICD-10-CM

## 2015-11-09 DIAGNOSIS — Z9889 Other specified postprocedural states: Secondary | ICD-10-CM

## 2015-11-09 MED ORDER — ZOSTER VACCINE LIVE 19400 UNT/0.65ML ~~LOC~~ SUSR: 0.6500 mL | Freq: Once | SUBCUTANEOUS | 0 refills | Status: AC

## 2015-11-09 NOTE — Progress Notes (Signed)
GYNECOLOGY  VISIT   HPI: 60 y.o.   Married  Caucasian  female   [phone] with Patient's last menstrual period was 01/11/2007.   here for 6 week follow up   ANTERIOR (CYSTOCELE) AND POSTERIOR REPAIR (RECTOCELE) (N/A Vagina ) TRANSVAGINAL TAPE (TVT) PROCEDURE exact mid-urethral sling (N/A Vagina ) CYSTOSCOPY (N/A Bladder) HYSTERECTOMY TOTAL LAPAROSCOPIC (N/A Abdomen) [id number] CPT (R)] SALPINGO OOPHORECTOMY (Bilateral Abdomen) [id number] CPT (R)].  Has a cough right now.  Not productive.  Has tried cough suppressants.  No fevers. Not sleeping well due to cough.  Declines Rx for cough.   Did get her flu vaccine.   No leakage of urine with cough.  Still has some urgency, but it is much less than prior to surgery. Was on Ditropan XL prior to surgery. Normal bowel function.  No vaginal drainage or bleeding.   Wants a shingles vaccine.   GYNECOLOGIC HISTORY: Patient's last menstrual period was 01/11/2007. Contraception: Hysterectomy Menopausal hormone therapy:  none Last mammogram:   07-29-15 3D/Density B/oval mass upper outer quad.Lt.breast--U/S reveals 16mm duct ectasia of Lt.breast probably benign. Rec.f/u Lt.mmg and U/S in 29mo/BiRads3:[facility]  Last pap smear:    05-05-15 neg HPV HR neg        OB History    Gravida Para Term Preterm AB Living   3 2 2   1 2    SAB TAB Ectopic Multiple Live Births   1       2         Patient Active Problem List   Diagnosis Date Noted  . Status post laparoscopic hysterectomy 09/29/2015  . 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 Depression 03/19/2013  . Other and unspecified hyperlipidemia 03/19/2013  . Postmenopausal 03/19/2013  . TIA (transient ischemic attack) 03/19/2013  . HTN (hypertension) 11/02/2012  . Numbness 09/20/2012  . Benign neoplasm of colon 06/08/2010  . Esophageal reflux 04/19/2010  . Dysphagia, unspecified(787.20) 04/19/2010    Past Medical History:  Diagnosis Date  . Arthritis    right hip  .  Depression   . Esophageal stricture 2011  . GERD (gastroesophageal reflux disease)   . Glucose intolerance (impaired glucose tolerance) 04/2015  . Head ache   . Hyperlipidemia   . Hypertension   . Left-sided weakness    due to TIA  . Pinched nerve in shoulder    left  . Sleep apnea   . Stroke ([place]) 2013   TIA on OCP, NO ESTROGEN.  recurrent TIA 08/2012  . Weakness    left arm past TIA    Past Surgical History:  Procedure Laterality Date  . ANTERIOR AND POSTERIOR REPAIR N/A 09/29/2015   Procedure: ANTERIOR (CYSTOCELE) AND POSTERIOR REPAIR (RECTOCELE);  Surgeon: Nunzio Cobbs, MD;  Location: [facility];  Service: Gynecology;  Laterality: N/A;  . BLADDER SUSPENSION N/A 09/29/2015   Procedure: TRANSVAGINAL TAPE (TVT) PROCEDURE exact mid-urethral sling;  Surgeon: Nunzio Cobbs, MD;  Location: [facility];  Service: Gynecology;  Laterality: N/A;  . CYSTOSCOPY N/A 09/29/2015   Procedure: CYSTOSCOPY;  Surgeon: Nunzio Cobbs, MD;  Location: [facility];  Service: Gynecology;  Laterality: N/A;  . ESOPHAGOGASTRODUODENOSCOPY    . LAPAROSCOPIC HYSTERECTOMY N/A 09/29/2015   Procedure: HYSTERECTOMY TOTAL LAPAROSCOPIC;  Surgeon: Nunzio Cobbs, MD;  Location: [facility];  Service: Gynecology;  Laterality: N/A;  . SALPINGOOPHORECTOMY Bilateral 09/29/2015   Procedure: SALPINGO OOPHORECTOMY;  Surgeon: Nunzio Cobbs, MD;  Location: [facility];  Service: Gynecology;  Laterality: Bilateral;  . TUBAL LIGATION  1979  . UMBILICAL HERNIA REPAIR  1979    Current Outpatient Prescriptions  Medication Sig Dispense Refill  . aspirin 325 MG tablet Take 325 mg by mouth daily.    . benzonatate (TESSALON) 200 MG capsule     . BYSTOLIC 5 MG tablet Take 1 tablet by mouth daily.    . cetirizine (ZYRTEC) 10 MG tablet Take 10 mg by mouth daily.    Marland Kitchen FLUoxetine (PROZAC) 20 MG capsule Take 40 mg by mouth daily.     Marland Kitchen ibuprofen (ADVIL,MOTRIN) 600 MG tablet Take 1 tablet (600 mg total) by  mouth every 6 (six) hours as needed (mild pain). 30 tablet 0  . Melatonin 2.5 MG CAPS Take 2.5 mg by mouth at bedtime as needed (sleep).    Marland Kitchen omeprazole-sodium bicarbonate (ZEGERID) 40-1100 MG per capsule Take 1 capsule by mouth daily.    . rosuvastatin (CRESTOR) 40 MG tablet Take 1 tablet by mouth daily.    . verapamil (CALAN-SR) 240 MG CR tablet Take 240 mg by mouth at bedtime.    Marland Kitchen oxybutynin (DITROPAN-XL) 10 MG 24 hr tablet Take 1 tablet by mouth daily.     No current facility-administered medications for this visit.      ALLERGIES: Review of patient's allergies indicates no known allergies.  Family History  Problem Relation Age of Onset  . Colon polyps Mother     father  . Irritable bowel syndrome Mother   . Lung cancer Mother   . Multiple births Mother     one set of twins  . Depression Mother   . Brain cancer Brother   . Diabetes Sister   . Heart disease Father   . Colon polyps Father   . Colon cancer Neg Hx     Social History   Social History  . Marital status: Married    Spouse name: N/A  . Number of children: 2  . Years of education: N/A   Occupational History  . bookkeeper    Social History Main Topics  . Smoking status: Former Smoker    Packs/day: 1.00    Years: 13.00    Types: Cigarettes    Quit date: 01/10/1990  . Smokeless tobacco: Never Used  . Alcohol use 0.6 oz/week    1 Glasses of wine per week  . Drug use: No  . Sexual activity: No     Comment: btl   Other Topics Concern  . Not on file   Social History Narrative  . No narrative on file    ROS:  Pertinent items are noted in HPI.  PHYSICAL EXAMINATION:    BP 134/80 (BP Location: Right Arm, Patient Position: Sitting, Cuff Size: Large)   Pulse 84   Ht 5\' 7"  (1.702 m)   LMP 01/11/2007 Comment: on Depo X 4 yrs prior    General appearance: alert, cooperative and appears stated age   Abdomen: incisions intact, soft, non-tender, no masses,  no organomegaly   Pelvic: External genitalia:   no lesions              Urethra:  normal appearing urethra with no masses, tenderness or lesions              Bartholins and Skenes: normal                 Vagina:  Suture lines intact. Sling protected.  Cervix: Absent.                 Bimanual Exam:  Uterus:  Absent.               Adnexa:  No masses.             Chaperone was present for exam.  ASSESSMENT  Status post total laparoscopic hysterectomy with bilateral salpingo-oophorectomy, anterior and posterior colporrhaphy, TVT Exact midurethral sling,cystoscopy. Doing well overall. Urinary urgency.   PLAN  OK to restart Ditropan Xl 10 mg daily.   OK to return to work tomorrow.  Follow up in 6 more weeks for final post op visit.  Sent Rx to pharmacy for Zostavax.  To PCP if cough worsens.   An After Visit Summary was printed and given to the patient.

## 2015-11-09 NOTE — Patient Instructions (Addendum)
It is ok to start your Ditropan XL 10 mg daily.   I will see you for your 3 month post op visit.

## 2015-12-23 ENCOUNTER — Ambulatory Visit (INDEPENDENT_AMBULATORY_CARE_PROVIDER_SITE_OTHER): Payer: Managed Care, Other (non HMO) | Admitting: Obstetrics and Gynecology

## 2015-12-23 ENCOUNTER — Encounter: Payer: Self-pay | Admitting: Obstetrics and Gynecology

## 2015-12-23 VITALS — BP 122/70 | HR 68 | Resp 16 | Ht 67.0 in | Wt 215.0 lb

## 2015-12-23 DIAGNOSIS — Z9889 Other specified postprocedural states: Secondary | ICD-10-CM

## 2015-12-23 DIAGNOSIS — N3281 Overactive bladder: Secondary | ICD-10-CM

## 2015-12-23 MED ORDER — OXYBUTYNIN CHLORIDE ER 15 MG PO TB24: 15.0000 mg | ORAL_TABLET | Freq: Every day | ORAL | 8 refills | Status: DC

## 2015-12-23 NOTE — Progress Notes (Signed)
GYNECOLOGY  VISIT   HPI: 60 y.o.   Married  Caucasian  female   [phone] with Patient's last menstrual period was 01/11/2007.   here for   12 week post op.  ANTERIOR (CYSTOCELE) AND POSTERIOR REPAIR (RECTOCELE) (N/A Vagina ) TRANSVAGINAL TAPE (TVT) PROCEDURE exact mid-urethral sling (N/A Vagina ) CYSTOSCOPY (N/A Bladder) HYSTERECTOMY TOTAL LAPAROSCOPIC (N/A Abdomen) [id number] CPT (R)] SALPINGO OOPHORECTOMY (Bilateral Abdomen) [id number] CPT (R)]  Good bladder function.  Urinary incontinence every now and then - spasms. Taking Ditropan at night and does not get 24 hour relief.  No leakage with coughing.   Good bowel function and control.   Happy with surgical outcome.  "You changed my life."  Never has used vaginal estrogens.   Recovering from pneumonia.   GYNECOLOGIC HISTORY: Patient's last menstrual period was 01/11/2007. Contraception:  Hysterectomy Menopausal hormone therapy:  none Last mammogram:  07-29-15 3D/Density B/oval mass upper outer quad.Lt.breast--U/S reveals 3mm duct ectasia of Lt.breast probably benign. Rec.f/u Lt.mmg and U/S in 17mo/BiRads3:[facility]  Last pap smear:   05-05-15 neg HPV HR neg        OB History    Gravida Para Term Preterm AB Living   3 2 2   1 2    SAB TAB Ectopic Multiple Live Births   1       2         Patient Active Problem List   Diagnosis Date Noted  . Status post laparoscopic hysterectomy 09/29/2015  . 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 Depression 03/19/2013  . Other and unspecified hyperlipidemia 03/19/2013  . Postmenopausal 03/19/2013  . TIA (transient ischemic attack) 03/19/2013  . HTN (hypertension) 11/02/2012  . Numbness 09/20/2012  . Benign neoplasm of colon 06/08/2010  . Esophageal reflux 04/19/2010  . Dysphagia, unspecified(787.20) 04/19/2010    Past Medical History:  Diagnosis Date  . Arthritis    right hip  . Depression   . Esophageal stricture 2011  . GERD (gastroesophageal reflux disease)    . Glucose intolerance (impaired glucose tolerance) 04/2015  . Head ache   . Hyperlipidemia   . Hypertension   . Left-sided weakness    due to TIA  . Pinched nerve in shoulder    left  . Sleep apnea   . Stroke ([place]) 2013   TIA on OCP, NO ESTROGEN.  recurrent TIA 08/2012  . Weakness    left arm past TIA    Past Surgical History:  Procedure Laterality Date  . ANTERIOR AND POSTERIOR REPAIR N/A 09/29/2015   Procedure: ANTERIOR (CYSTOCELE) AND POSTERIOR REPAIR (RECTOCELE);  Surgeon: Nunzio Cobbs, MD;  Location: [facility];  Service: Gynecology;  Laterality: N/A;  . BLADDER SUSPENSION N/A 09/29/2015   Procedure: TRANSVAGINAL TAPE (TVT) PROCEDURE exact mid-urethral sling;  Surgeon: Nunzio Cobbs, MD;  Location: [facility];  Service: Gynecology;  Laterality: N/A;  . CYSTOSCOPY N/A 09/29/2015   Procedure: CYSTOSCOPY;  Surgeon: Nunzio Cobbs, MD;  Location: [facility];  Service: Gynecology;  Laterality: N/A;  . ESOPHAGOGASTRODUODENOSCOPY    . LAPAROSCOPIC HYSTERECTOMY N/A 09/29/2015   Procedure: HYSTERECTOMY TOTAL LAPAROSCOPIC;  Surgeon: Nunzio Cobbs, MD;  Location: [facility];  Service: Gynecology;  Laterality: N/A;  . SALPINGOOPHORECTOMY Bilateral 09/29/2015   Procedure: SALPINGO OOPHORECTOMY;  Surgeon: Nunzio Cobbs, MD;  Location: [facility];  Service: Gynecology;  Laterality: Bilateral;  . TUBAL LIGATION  1979  . [place]  Current Outpatient Prescriptions  Medication Sig Dispense Refill  . aspirin 325 MG tablet Take 325 mg by mouth daily.    . benzonatate (TESSALON) 200 MG capsule     . BYSTOLIC 5 MG tablet Take 1 tablet by mouth daily.    . cetirizine (ZYRTEC) 10 MG tablet Take 10 mg by mouth daily.    Marland Kitchen FLUoxetine (PROZAC) 20 MG capsule Take 40 mg by mouth daily.     Marland Kitchen HYDROcodone-homatropine (HYCODAN) 5-1.5 MG/5ML syrup Take by mouth as needed.    Marland Kitchen ibuprofen (ADVIL,MOTRIN) 600 MG tablet Take 1 tablet (600 mg total) by mouth  every 6 (six) hours as needed (mild pain). 30 tablet 0  . Melatonin 2.5 MG CAPS Take 2.5 mg by mouth at bedtime as needed (sleep).    Marland Kitchen omeprazole-sodium bicarbonate (ZEGERID) 40-1100 MG per capsule Take 1 capsule by mouth daily.    Glory Rosebush DELICA LANCETS [id number] MISC     . ONETOUCH VERIO test strip     . oxybutynin (DITROPAN-XL) 10 MG 24 hr tablet Take 1 tablet by mouth daily.    . rosuvastatin (CRESTOR) 40 MG tablet Take 1 tablet by mouth daily.    . verapamil (CALAN-SR) 240 MG CR tablet Take 240 mg by mouth at bedtime.     No current facility-administered medications for this visit.      ALLERGIES: Patient has no known allergies.  Family History  Problem Relation Age of Onset  . Colon polyps Mother     father  . Irritable bowel syndrome Mother   . Lung cancer Mother   . Multiple births Mother     one set of twins  . Depression Mother   . Brain cancer Brother   . Diabetes Sister   . Heart disease Father   . Colon polyps Father   . Colon cancer Neg Hx     Social History   Social History  . Marital status: Married    Spouse name: N/A  . Number of children: 2  . Years of education: N/A   Occupational History  . bookkeeper    Social History Main Topics  . Smoking status: Former Smoker    Packs/day: 1.00    Years: 13.00    Types: Cigarettes    Quit date: 01/10/1990  . Smokeless tobacco: Never Used  . Alcohol use 0.6 oz/week    1 Glasses of wine per week  . Drug use: No  . Sexual activity: No     Comment: btl   Other Topics Concern  . Not on file   Social History Narrative  . No narrative on file    ROS:  Pertinent items are noted in HPI.  PHYSICAL EXAMINATION:    BP 122/70 (BP Location: Right Arm, Patient Position: Sitting, Cuff Size: Normal)   Pulse 68   Resp 16   Ht 5\' 7"  (1.702 m)   Wt 215 lb (97.5 kg)   LMP 01/11/2007 Comment: on Depo X 4 yrs prior  BMI 33.67 kg/m     General appearance: alert, cooperative and appears stated age   Abdomen:  incisions intact, soft, non-tender, no masses,  no organomegaly   Pelvic: External genitalia:  SP incisions intact, no lesions              Urethra:  normal appearing urethra with no masses, tenderness or lesions              Bartholins and Skenes: normal  Vagina: normal appearing vagina with normal color and discharge, no lesions.  Excellent support.  Good vaginal caliber and length.  Midurethral sling protected.  Small amount of vaginal cuff suture came out with Qtip exam of cuff.               Cervix:  absent                Bimanual Exam:  Uterus:   Absent.               Adnexa: no mass, fullness, tenderness                Chaperone was present for exam.  ASSESSMENT  Doing well post op.  Overactive bladder.   PLAN  Ok to resume all normal acitivies in 1 week - lifting and sexual activity. Increase Ditropan XL to 15 mg daily.  Annual exam with Edman Circle in April 2018.   An After Visit Summary was printed and given to the patient.

## 2016-01-01 ENCOUNTER — Institutional Professional Consult (permissible substitution): Payer: Managed Care, Other (non HMO) | Admitting: Internal Medicine

## 2016-02-02 ENCOUNTER — Institutional Professional Consult (permissible substitution): Payer: Managed Care, Other (non HMO) | Admitting: Emergency Medicine

## 2016-05-06 ENCOUNTER — Encounter: Payer: Self-pay | Admitting: Nurse Practitioner

## 2016-05-06 ENCOUNTER — Ambulatory Visit (INDEPENDENT_AMBULATORY_CARE_PROVIDER_SITE_OTHER): Payer: Managed Care, Other (non HMO) | Admitting: Nurse Practitioner

## 2016-05-06 VITALS — BP 132/74 | HR 68 | Ht 66.5 in | Wt 221.0 lb

## 2016-05-06 DIAGNOSIS — Z01419 Encounter for gynecological examination (general) (routine) without abnormal findings: Secondary | ICD-10-CM | POA: Diagnosis not present

## 2016-05-06 DIAGNOSIS — Z0000 Encounter for general adult medical examination without abnormal findings: Secondary | ICD-10-CM

## 2016-05-06 MED ORDER — OXYBUTYNIN CHLORIDE ER 15 MG PO TB24: 15.0000 mg | ORAL_TABLET | Freq: Every day | ORAL | 4 refills | Status: DC

## 2016-05-06 NOTE — Patient Instructions (Signed)

## 2016-05-06 NOTE — Progress Notes (Signed)
61 y.o. [id number] Married  Caucasian Fe here for annual exam.  Since last AEX she underwent multiple studies for urinary incontinence.  She then had LAV/ BSO, mid urethral sling with A&P repair in September with Dr. Quincy Simmonds.  Now no bladder incontinence leakage, pain.  remains of Ditropan 15 mg daily.  Does not need to wear pads.  Not SA so no pain with intercourse.  Has history of 2 ulcers after a course of coughing in 10/2015.  She has had a good report about her blood sugars and they are now normal without medications.  Patient's last menstrual period was 01/11/2007.          Sexually active: No.  The current method of family planning is tubal ligation and status post hysterectomy.    Exercising: No.  The patient does not participate in regular exercise at present. Smoker:  no  Health Maintenance: Pap: 05/05/15, Negative with neg HR HPV  02/14/12, Negative with neg HR HPV History of Abnormal Pap: no MMG: 07/29/15 with diagnostic left mammogram and ultrasound- Bi-Rads 3: Probably Benign, follow up diagnostic left mammogram and ultrasound 02/04/16: Bi-Rads 3: Probably Benign, follow up bilateral in 6 months Self Breast exams: yes Colonoscopy: 06/25/13, Normal, repeat in 7 years.  EDG 10/2015 with 2 ulcers, on e esophagus and 1 stomach. BMD: 07/29/15 T Score: -0.50 Spine / +0.30 Right Femur Neck / -0.20 Left Femur Neck TDaP:  01/10/14 Shingles: 03/14/16 Shingrix #1 Pneumonia: Not indicated due to age Hep C and HIV: 05/05/15 Labs: PCP takes care of all screening labs   reports that she quit smoking about 26 years ago. Her smoking use included Cigarettes. She has a 13.00 pack-year smoking history. She has never used smokeless tobacco. She reports that she drinks about 0.6 oz of alcohol per week . She reports that she does not use drugs.  Past Medical History:  Diagnosis Date  . Arthritis    right hip  . Depression   . Esophageal stricture 2011  . GERD (gastroesophageal reflux disease)   . Glucose  intolerance (impaired glucose tolerance) 04/2015  . Head ache   . Hyperlipidemia   . Hypertension   . Left-sided weakness    due to TIA  . Pinched nerve in shoulder    left  . Sleep apnea   . Stroke ([place]) 2013   TIA on OCP, NO ESTROGEN.  recurrent TIA 08/2012  . Weakness    left arm past TIA    Past Surgical History:  Procedure Laterality Date  . ANTERIOR AND POSTERIOR REPAIR N/A 09/29/2015   Procedure: ANTERIOR (CYSTOCELE) AND POSTERIOR REPAIR (RECTOCELE);  Surgeon: Nunzio Cobbs, MD;  Location: [facility];  Service: Gynecology;  Laterality: N/A;  . BLADDER SUSPENSION N/A 09/29/2015   Procedure: TRANSVAGINAL TAPE (TVT) PROCEDURE exact mid-urethral sling;  Surgeon: Nunzio Cobbs, MD;  Location: [facility];  Service: Gynecology;  Laterality: N/A;  . CYSTOSCOPY N/A 09/29/2015   Procedure: CYSTOSCOPY;  Surgeon: Nunzio Cobbs, MD;  Location: [facility];  Service: Gynecology;  Laterality: N/A;  . ESOPHAGOGASTRODUODENOSCOPY    . LAPAROSCOPIC HYSTERECTOMY N/A 09/29/2015   Procedure: HYSTERECTOMY TOTAL LAPAROSCOPIC;  Surgeon: Nunzio Cobbs, MD;  Location: [facility];  Service: Gynecology;  Laterality: N/A;  . SALPINGOOPHORECTOMY Bilateral 09/29/2015   Procedure: SALPINGO OOPHORECTOMY;  Surgeon: Nunzio Cobbs, MD;  Location: [facility];  Service: Gynecology;  Laterality: Bilateral;  . TUBAL LIGATION  1979  . UMBILICAL  HERNIA REPAIR  1979    Current Outpatient Prescriptions  Medication Sig Dispense Refill  . aspirin 325 MG tablet Take 325 mg by mouth daily.    . cetirizine (ZYRTEC) 10 MG tablet Take 10 mg by mouth daily.    Marland Kitchen FLUoxetine (PROZAC) 20 MG capsule Take 40 mg by mouth daily.     Marland Kitchen losartan (COZAAR) 50 MG tablet Take 50 mg by mouth daily.  3  . Melatonin 2.5 MG CAPS Take 2.5 mg by mouth at bedtime as needed (sleep).    . metoCLOPramide (REGLAN) 10 MG tablet Take 1 tablet by mouth 4 (four) times daily.    Marland Kitchen omeprazole (PRILOSEC) 40 MG capsule Take 1  capsule by mouth daily.    Glory Rosebush DELICA LANCETS 62X MISC     . ONETOUCH VERIO test strip     . oxybutynin (DITROPAN XL) 15 MG 24 hr tablet Take 1 tablet (15 mg total) by mouth at bedtime. 30 tablet 8  . rosuvastatin (CRESTOR) 40 MG tablet Take 1 tablet by mouth daily.    . verapamil (CALAN-SR) 240 MG CR tablet Take 240 mg by mouth at bedtime.     No current facility-administered medications for this visit.     Family History  Problem Relation Age of Onset  . Colon polyps Mother     father  . Irritable bowel syndrome Mother   . Lung cancer Mother   . Multiple births Mother     one set of twins  . Depression Mother   . Brain cancer Brother   . Diabetes Sister   . Lupus Sister   . Heart disease Father   . Colon polyps Father   . Colon cancer Neg Hx     ROS:  Pertinent items are noted in HPI.  Otherwise, a comprehensive ROS was negative.  Exam:   BP 132/74 (BP Location: Right Arm, Patient Position: Sitting, Cuff Size: Large)   Pulse 68   Ht 5' 6.5" (1.689 m)   Wt 221 lb (100.2 kg)   LMP 01/11/2007 Comment: on Depo X 4 yrs prior  BMI 35.14 kg/m  Height: 5' 6.5" (168.9 cm) Ht Readings from Last 3 Encounters:  05/06/16 5' 6.5" (1.689 m)  12/23/15 5\' 7"  (1.702 m)  11/09/15 5\' 7"  (1.702 m)    General appearance: alert, cooperative and appears stated age Head: Normocephalic, without obvious abnormality, atraumatic Neck: no adenopathy, supple, symmetrical, trachea midline and thyroid normal to inspection and palpation Lungs: clear to auscultation bilaterally Breasts: normal appearance, no masses or tenderness Heart: regular rate and rhythm Abdomen: soft, non-tender; no masses,  no organomegaly Extremities: extremities normal, atraumatic, no cyanosis or edema Skin: Skin color, texture, turgor normal. No rashes or lesions Lymph nodes: Cervical, supraclavicular, and axillary nodes normal. No abnormal inguinal nodes palpated Neurologic: Grossly normal   Pelvic: External  genitalia:  no lesions              Urethra:  normal appearing urethra with no masses, tenderness or lesions              Bartholin's and Skene's: normal                 Vagina: normal appearing vagina with normal color and discharge, no lesions              Cervix: absent              Pap taken: No. Bimanual Exam:  Uterus:  uterus absent  Adnexa: no mass, fullness, tenderness               Rectovaginal: Confirms               Anus:  normal sphincter tone, no lesions  Chaperone present: no  A:  Well Woman with normal exam Postmenopausal - NO estrogen History of TIA on OCP  History of recurrent TIA 08/2012 History of GERD, Esophageal stricture with ulcer 10/17, hyperlipidemia - now on Crestor, depression, HTN BMI back up to 35.14 Urinary incontinence- much improved on medication and surgery            S/P LAV/ BSO, A&P repair, mid urethral sling and cysto 09/29/15   P:   Reviewed health and wellness pertinent to exam  Pap smear: no  Mammogram is due 07/2016  Refill on Ditropan 15 mg for a year  Recheck Vit D  Counseled on breast self exam, mammography screening, adequate intake of calcium and vitamin D, diet and exercise, Kegel's exercises return annually or prn  An After Visit Summary was printed and given to the patient.

## 2016-05-07 LAB — VITAMIN D 25 HYDROXY (VIT D DEFICIENCY, FRACTURES): VIT D 25 HYDROXY: 15 ng/mL — AB (ref 30–100)

## 2016-05-07 NOTE — Progress Notes (Signed)
Encounter reviewed by Dr. Zeenat Jeanbaptiste Amundson C. Silva.  

## 2016-05-09 ENCOUNTER — Telehealth: Payer: Self-pay

## 2016-05-09 DIAGNOSIS — E559 Vitamin D deficiency, unspecified: Secondary | ICD-10-CM

## 2016-05-09 MED ORDER — VITAMIN D (ERGOCALCIFEROL) 1.25 MG (50000 UNIT) PO CAPS: 50000.0000 [IU] | ORAL_CAPSULE | ORAL | 0 refills | Status: DC

## 2016-05-09 NOTE — Telephone Encounter (Signed)
-----   Message from Kem Boroughs, [place] sent at 05/09/2016  7:53 AM EDT ----- Please notify pt with Vit D and start her on Vit D per protocol. She does need a 3 months lab.

## 2016-05-09 NOTE — Telephone Encounter (Signed)
Spoke with patient. Results given as seen below from Casey Chen, [place]. Patient is agreeable and verbalizes understanding. Rx for Vitamin D 50,000 IU take 1 tablet every 7 days #14 0RF sent to pharmacy on file. Lab appointment scheduled for 08/09/2016 at 10 am. Patient is agreeable to date and time.  Routing to provider for final review. Patient agreeable to disposition. Will close encounter.

## 2016-08-02 ENCOUNTER — Other Ambulatory Visit: Payer: Self-pay | Admitting: Nurse Practitioner

## 2016-08-02 NOTE — Telephone Encounter (Signed)
Medication refill request: Vitamin D  Last AEX:  05-06-16  Next AEX: 04-29-17  Last MMG (if hormonal medication request): [phone] breast U/S- repeat U/S in 6 months Refill authorized: please advise

## 2016-08-03 NOTE — Addendum Note (Signed)
Addended by: Susanne Greenhouse E on: 08/03/2016 11:37 AM   Modules accepted: Orders

## 2016-08-03 NOTE — Telephone Encounter (Signed)
Pt has follow up lab scheduled for 08/09/16.  Can you change this order so it will result to me.  Patty ordered the follow-up.  I will make recommendation for additional medications pending that result.  Order declined for now.

## 2016-08-03 NOTE — Telephone Encounter (Signed)
Order changed to results to Dr. Sabra Heck

## 2016-08-09 ENCOUNTER — Other Ambulatory Visit (INDEPENDENT_AMBULATORY_CARE_PROVIDER_SITE_OTHER): Payer: Managed Care, Other (non HMO)

## 2016-08-09 DIAGNOSIS — E559 Vitamin D deficiency, unspecified: Secondary | ICD-10-CM

## 2016-08-10 LAB — VITAMIN D 25 HYDROXY (VIT D DEFICIENCY, FRACTURES): VIT D 25 HYDROXY: 38.4 ng/mL (ref 30.0–100.0)

## 2016-10-04 ENCOUNTER — Encounter: Payer: Self-pay | Admitting: Obstetrics and Gynecology

## 2016-10-24 NOTE — Telephone Encounter (Signed)
Closing Encounter.

## 2017-03-13 ENCOUNTER — Other Ambulatory Visit: Payer: Self-pay

## 2017-03-13 DIAGNOSIS — R42 Dizziness and giddiness: Secondary | ICD-10-CM

## 2017-03-13 DIAGNOSIS — I701 Atherosclerosis of renal artery: Secondary | ICD-10-CM

## 2017-04-06 ENCOUNTER — Ambulatory Visit (INDEPENDENT_AMBULATORY_CARE_PROVIDER_SITE_OTHER)
Admission: RE | Admit: 2017-04-06 | Discharge: 2017-04-06 | Disposition: A | Discharge status: Home / Self Care | Payer: Managed Care, Other (non HMO) | Source: Ambulatory Visit | Attending: Vascular Surgery | Admitting: Vascular Surgery

## 2017-04-06 ENCOUNTER — Ambulatory Visit (HOSPITAL_COMMUNITY)
Admission: RE | Admit: 2017-04-06 | Discharge: 2017-04-06 | Disposition: A | Discharge status: Home / Self Care | Payer: Managed Care, Other (non HMO) | Source: Ambulatory Visit | Attending: Vascular Surgery | Admitting: Vascular Surgery

## 2017-04-06 DIAGNOSIS — I6523 Occlusion and stenosis of bilateral carotid arteries: Secondary | ICD-10-CM | POA: Diagnosis not present

## 2017-04-06 DIAGNOSIS — R42 Dizziness and giddiness: Secondary | ICD-10-CM

## 2017-04-06 DIAGNOSIS — I701 Atherosclerosis of renal artery: Secondary | ICD-10-CM | POA: Diagnosis not present

## 2017-05-09 ENCOUNTER — Ambulatory Visit: Payer: Managed Care, Other (non HMO) | Admitting: Nurse Practitioner

## 2017-05-10 ENCOUNTER — Encounter: Payer: Self-pay | Admitting: Obstetrics and Gynecology

## 2017-05-10 ENCOUNTER — Ambulatory Visit: Payer: Managed Care, Other (non HMO) | Admitting: Obstetrics and Gynecology

## 2017-05-10 ENCOUNTER — Other Ambulatory Visit: Payer: Self-pay

## 2017-05-10 VITALS — BP 104/60 | HR 88 | Resp 16 | Ht 66.5 in | Wt 214.0 lb

## 2017-05-10 DIAGNOSIS — Z01419 Encounter for gynecological examination (general) (routine) without abnormal findings: Secondary | ICD-10-CM | POA: Diagnosis not present

## 2017-05-10 DIAGNOSIS — N3941 Urge incontinence: Secondary | ICD-10-CM

## 2017-05-10 MED ORDER — OXYBUTYNIN CHLORIDE ER 10 MG PO TB24: ORAL_TABLET | ORAL | 1 refills | Status: DC

## 2017-05-10 NOTE — Progress Notes (Signed)
62 y.o. [id number] MarriedCaucasianF here for annual exam.  H/O LAVH/BSO/A&P repair and TVT. She has bad urge incontinence, she is on ditropan. She leaks about 4 days, leaks large amounts. Once it starts she can't stop it. She occasionally wears pads, but they irritate her. She is up one time at night to void.  No vaginal bleeding. 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   Her kidney function is down to 35%. Per up to date, no adjustments are recommended for ditropan with renal issues. She is seeing the nephrologist later this month and will discuss it with him.     Patient's last menstrual period was 01/11/2007.          Sexually active: No.  The current method of family planning is status post hysterectomy.    Exercising: No.  The patient does not participate in regular exercise at present. Smoker:  Former smoker  Health Maintenance: Pap:  05-05-15 WNL NEG HR HPV  History of abnormal Pap:  no MMG:  08-04-16 U/S cyst otherwise normal Colonoscopy:  06-25-13 repeat in 68yrs  BMD:   07-29-15 WNL  TDaP:  2016 Gardasil: N/A   reports that she quit smoking about 27 years ago. Her smoking use included cigarettes. She has a 13.00 pack-year smoking history. She has never used smokeless tobacco. She reports that she drank alcohol. She reports that she does not use drugs. She used to drink a beer a night, not currently drinking at all. Works as a Clinical cytogeneticist. She has 2 kids, 4 grandchildren. Her daughter and 2 of her grandchildren live with her.   Past Medical History:  Diagnosis Date  . Arthritis    right hip  . Depression   . Esophageal stricture 2011  . GERD (gastroesophageal reflux disease)   . Glucose intolerance (impaired glucose tolerance) 04/2015  . Head ache   . Hyperlipidemia   . Hypertension   . Left-sided weakness    due to TIA  . Pinched nerve in shoulder    left  . Sleep apnea   . Stroke ([place]) 2013   TIA on OCP, NO ESTROGEN.  recurrent TIA 08/2012  . Weakness    left arm past TIA     Past Surgical History:  Procedure Laterality Date  . ANTERIOR AND POSTERIOR REPAIR N/A 09/29/2015   Procedure: ANTERIOR (CYSTOCELE) AND POSTERIOR REPAIR (RECTOCELE);  Surgeon: Nunzio Cobbs, MD;  Location: [facility];  Service: Gynecology;  Laterality: N/A;  . BLADDER SUSPENSION N/A 09/29/2015   Procedure: TRANSVAGINAL TAPE (TVT) PROCEDURE exact mid-urethral sling;  Surgeon: Nunzio Cobbs, MD;  Location: [facility];  Service: Gynecology;  Laterality: N/A;  . CYSTOSCOPY N/A 09/29/2015   Procedure: CYSTOSCOPY;  Surgeon: Nunzio Cobbs, MD;  Location: [facility];  Service: Gynecology;  Laterality: N/A;  . ESOPHAGOGASTRODUODENOSCOPY    . LAPAROSCOPIC HYSTERECTOMY N/A 09/29/2015   Procedure: HYSTERECTOMY TOTAL LAPAROSCOPIC;  Surgeon: Nunzio Cobbs, MD;  Location: [facility];  Service: Gynecology;  Laterality: N/A;  . SALPINGOOPHORECTOMY Bilateral 09/29/2015   Procedure: SALPINGO OOPHORECTOMY;  Surgeon: Nunzio Cobbs, MD;  Location: [facility];  Service: Gynecology;  Laterality: Bilateral;  . TUBAL LIGATION  1979  . UMBILICAL HERNIA REPAIR  1979    Current Outpatient Medications  Medication Sig Dispense Refill  . aspirin 325 MG tablet Take 325 mg by mouth daily.    . cetirizine (ZYRTEC) 10 MG tablet Take 10 mg by mouth daily.    Marland Kitchen  Cholecalciferol (VITAMIN D) 2000 units tablet Take 2,000 Units by mouth daily.    Marland Kitchen FLUoxetine (PROZAC) 20 MG capsule Take 40 mg by mouth daily.     Marland Kitchen losartan (COZAAR) 50 MG tablet Take 50 mg by mouth daily.  3  . Melatonin 2.5 MG CAPS Take 2.5 mg by mouth at bedtime as needed (sleep).    . metoCLOPramide (REGLAN) 10 MG tablet Take 1 tablet by mouth 4 (four) times daily.    Marland Kitchen omeprazole (PRILOSEC) 40 MG capsule Take 1 capsule by mouth daily.    Glory Rosebush DELICA LANCETS 93Z MISC     . ONETOUCH VERIO test strip     . oxybutynin (DITROPAN XL) 15 MG 24 hr tablet Take 1 tablet (15 mg total) by mouth at bedtime. 90 tablet 4  .  rosuvastatin (CRESTOR) 40 MG tablet Take 1 tablet by mouth daily.    . verapamil (CALAN-SR) 240 MG CR tablet Take 240 mg by mouth at bedtime.     No current facility-administered medications for this visit.     Family History  Problem Relation Age of Onset  . Colon polyps Mother        father  . Irritable bowel syndrome Mother   . Lung cancer Mother   . Multiple births Mother        one set of twins  . Depression Mother   . Brain cancer Brother   . Diabetes Sister   . Lupus Sister   . Heart disease Father   . Colon polyps Father   . Colon cancer Neg Hx     Review of Systems  Constitutional: Negative.   HENT: Negative.   Eyes: Negative.   Respiratory: Negative.   Cardiovascular: Negative.   Gastrointestinal: Negative.   Endocrine: Negative.   Genitourinary: Negative.   Musculoskeletal: Negative.   Skin: Negative.   Allergic/Immunologic: Negative.   Neurological: Negative.   Psychiatric/Behavioral: Negative.     Exam:   BP 104/60 (BP Location: Right Arm, Patient Position: Sitting, Cuff Size: Normal)   Pulse 88   Resp 16   Ht 5' 6.5" (1.689 m)   Wt 214 lb (97.1 kg)   LMP 01/11/2007 Comment: on Depo X 4 yrs prior  BMI 34.02 kg/m   Weight change: @WEIGHTCHANGE @ Height:   Height: 5' 6.5" (168.9 cm)  Ht Readings from Last 3 Encounters:  05/10/17 5' 6.5" (1.689 m)  05/06/16 5' 6.5" (1.689 m)  12/23/15 5\' 7"  (1.702 m)    General appearance: alert, cooperative and appears stated age Head: Normocephalic, without obvious abnormality, atraumatic Neck: no adenopathy, supple, symmetrical, trachea midline and thyroid normal to inspection and palpation Lungs: clear to auscultation bilaterally Cardiovascular: regular rate and rhythm Breasts: normal appearance, no masses or tenderness Abdomen: soft, non-tender; non distended,  no masses,  no organomegaly Extremities: extremities normal, atraumatic, no cyanosis or edema Skin: Skin color, texture, turgor normal. No rashes or  lesions Lymph nodes: Cervical, supraclavicular, and axillary nodes normal. No abnormal inguinal nodes palpated Neurologic: Grossly normal   Pelvic: External genitalia:  no lesions              Urethra:  normal appearing urethra with no masses, tenderness or lesions              Bartholins and Skenes: normal                 Vagina: normal appearing vagina with normal color and discharge, no lesions  Cervix: absent               Bimanual Exam:  Uterus:  uterus absent              Adnexa: no mass, fullness, tenderness               Rectovaginal: Confirms               Anus:  normal sphincter tone, no lesions  Chaperone was present for exam.  A:  Well Woman with normal exam  Severe urge incontinence on 15 mg of ditropan  Renal insufficiency, has appointment with Nephrology  P:   Increase ditropan to 20 mg f/u in one month (may do the f/u with Urology)  Referral to Urology  No pap this year  Mammogram in 7/19  Colonoscopy and DEXA UTD  Labs with primary  Discussed breast self exam  Discussed calcium and vit D intake

## 2017-05-10 NOTE — Patient Instructions (Signed)

## 2017-06-07 ENCOUNTER — Other Ambulatory Visit: Payer: Self-pay | Admitting: Obstetrics and Gynecology

## 2017-06-07 NOTE — Telephone Encounter (Signed)
Medication refill request: ditropan  Last AEX:  05/10/17 JJ  Next AEX: 06/07/18  Last MMG (if hormonal medication request): 08/04/16 left breast US: BIRADS 2 benign  Refill authorized: 05/10/17 #30, 1 RF. Today, please advise.

## 2017-06-07 NOTE — Telephone Encounter (Signed)
The patient was supposed to f/u here of with urology. One month sent. Please check that she has f/u.

## 2017-06-08 NOTE — Telephone Encounter (Signed)
Detailed message left on mobile number per DPR asking patient to return call to update if she has scheduled a one month follow up with urology. Advised could ask to speak with Raquel Sarna.

## 2017-06-13 NOTE — Telephone Encounter (Signed)
Call to patient. Patient states she received voicemail from RN last week, but has not yet made an appointment with urology because she states the increase in ditropan is working for her. Patient states she did not know what her insurance would cover, so she has not made any appointments. Patient asking if she still needs f/u here with Dr. Talbert Nan since increase in medication is working? RN advised would need to review with Dr. Talbert Nan and return call. Patient agreeable.   Routing to provider for review.

## 2017-06-13 NOTE — Telephone Encounter (Signed)
Her dose of ditropan can still be adjusted. Unless she is cured, I would recommend f/u. Please check with her and let me know.

## 2017-06-14 MED ORDER — OXYBUTYNIN CHLORIDE ER 10 MG PO TB24: ORAL_TABLET | ORAL | 3 refills | Status: DC

## 2017-06-14 NOTE — Telephone Encounter (Signed)
Refills have been sent.  

## 2017-06-14 NOTE — Telephone Encounter (Signed)
Spoke with patient, advised as seen below per Dr. Talbert Nan. Patient states her symptoms have resolved, currently taking ditropan 20 mg at hs, does not desire to change dosage at this time. No OV scheduled.   Advised will update Dr. Talbert Nan and return call with any additional recommendations.

## 2017-06-14 NOTE — Telephone Encounter (Signed)
Patient notified of refills. Encounter closed.

## 2017-07-18 ENCOUNTER — Other Ambulatory Visit: Payer: Self-pay

## 2017-07-18 NOTE — Telephone Encounter (Signed)
Medication refill request: Oxybutynin Cl Er 15 Mg  Last AEX:  05/10/17 Next AEX: 06/07/18 Last MMG (if hormonal medication request):08/04/16  Bi-rads Category 2 Benign  Refill authorized: Please refill if appropriate.

## 2017-07-18 NOTE — Telephone Encounter (Signed)
The patient was supposed to f/u on the higher dose of ditropan, either here or with Urology. Please see if she f/u with them. If not check how she is doing on this dose of ditropan. If doing well, you can refill it for a year. She also has a h/o renal insufficiency and was supposed to clear the ditropan with the Nephrologist. Please make sure she did this prior to refilling it.

## 2017-07-19 MED ORDER — OXYBUTYNIN CHLORIDE ER 10 MG PO TB24
ORAL_TABLET | ORAL | 2 refills | Status: DC
Start: 2017-07-19 — End: 2018-06-07

## 2017-07-19 NOTE — Telephone Encounter (Signed)
Spoke with patient. Patient states she has not returned back to Urology due to cost. Did check with her Nephrologist and was approved to take Ditropan. Advised will refill until her next aex. Patient verbalizes understanding.

## 2017-09-04 ENCOUNTER — Encounter: Payer: Self-pay | Admitting: Certified Nurse Midwife

## 2018-05-29 ENCOUNTER — Emergency Department (HOSPITAL_COMMUNITY): Payer: 59

## 2018-05-29 ENCOUNTER — Encounter (HOSPITAL_COMMUNITY): Payer: Self-pay

## 2018-05-29 ENCOUNTER — Emergency Department (HOSPITAL_COMMUNITY)
Admission: EM | Admit: 2018-05-29 | Discharge: 2018-05-29 | Disposition: A | Discharge status: Home / Self Care | Payer: 59 | Attending: Emergency Medicine | Admitting: Emergency Medicine

## 2018-05-29 ENCOUNTER — Other Ambulatory Visit: Payer: Self-pay

## 2018-05-29 DIAGNOSIS — R112 Nausea with vomiting, unspecified: Secondary | ICD-10-CM | POA: Diagnosis not present

## 2018-05-29 DIAGNOSIS — Z87891 Personal history of nicotine dependence: Secondary | ICD-10-CM | POA: Insufficient documentation

## 2018-05-29 DIAGNOSIS — R1084 Generalized abdominal pain: Secondary | ICD-10-CM | POA: Diagnosis present

## 2018-05-29 DIAGNOSIS — I10 Essential (primary) hypertension: Secondary | ICD-10-CM | POA: Diagnosis not present

## 2018-05-29 DIAGNOSIS — K219 Gastro-esophageal reflux disease without esophagitis: Secondary | ICD-10-CM | POA: Insufficient documentation

## 2018-05-29 LAB — URINALYSIS, ROUTINE W REFLEX MICROSCOPIC
Bacteria, UA: NONE SEEN
Bilirubin Urine: NEGATIVE
Glucose, UA: NEGATIVE mg/dL
Ketones, ur: NEGATIVE mg/dL
Nitrite: NEGATIVE
Protein, ur: NEGATIVE mg/dL
Specific Gravity, Urine: 1.029 (ref 1.005–1.030)
pH: 6 (ref 5.0–8.0)

## 2018-05-29 LAB — COMPREHENSIVE METABOLIC PANEL
ALT: 18 U/L (ref 0–44)
AST: 18 U/L (ref 15–41)
Albumin: 4.2 g/dL (ref 3.5–5.0)
Alkaline Phosphatase: 85 U/L (ref 38–126)
Anion gap: 14 (ref 5–15)
BUN: 15 mg/dL (ref 8–23)
CO2: 22 mmol/L (ref 22–32)
Calcium: 9.6 mg/dL (ref 8.9–10.3)
Chloride: 104 mmol/L (ref 98–111)
Creatinine, Ser: 1.33 mg/dL — ABNORMAL HIGH (ref 0.44–1.00)
GFR calc Af Amer: 49 mL/min — ABNORMAL LOW (ref >60)
GFR calc non Af Amer: 42 mL/min — ABNORMAL LOW (ref >60)
Glucose, Bld: 122 mg/dL — ABNORMAL HIGH (ref 70–99)
Potassium: 3.5 mmol/L (ref 3.5–5.1)
Sodium: 140 mmol/L (ref 135–145)
Total Bilirubin: 0.6 mg/dL (ref 0.3–1.2)
Total Protein: 7.9 g/dL (ref 6.5–8.1)

## 2018-05-29 LAB — LIPASE, BLOOD: Lipase: 32 U/L (ref 11–51)

## 2018-05-29 LAB — CBC
HCT: 47.9 % — ABNORMAL HIGH (ref 36.0–46.0)
Hemoglobin: 15.4 g/dL — ABNORMAL HIGH (ref 12.0–15.0)
MCH: 29.6 pg (ref 26.0–34.0)
MCHC: 32.2 g/dL (ref 30.0–36.0)
MCV: 92.1 fL (ref 80.0–100.0)
Platelets: 302 10*3/uL (ref 150–400)
RBC: 5.2 MIL/uL — ABNORMAL HIGH (ref 3.87–5.11)
RDW: 14 % (ref 11.5–15.5)
WBC: 9.6 10*3/uL (ref 4.0–10.5)
nRBC: 0 % (ref 0.0–0.2)

## 2018-05-29 MED ORDER — ONDANSETRON 4 MG PO TBDP: 4.0000 mg | ORAL_TABLET | Freq: Three times a day (TID) | ORAL | 0 refills | Status: AC | PRN

## 2018-05-29 MED ORDER — ONDANSETRON HCL 4 MG/2ML IJ SOLN
4.0000 mg | Freq: Once | INTRAMUSCULAR | Status: AC
  Administered 2018-05-29: 13:00:00 4 mg via INTRAVENOUS
  Filled 2018-05-29: qty 2

## 2018-05-29 MED ORDER — IOHEXOL 300 MG/ML  SOLN
100.0000 mL | Freq: Once | INTRAMUSCULAR | Status: AC | PRN
  Administered 2018-05-29: 15:00:00 100 mL via INTRAVENOUS

## 2018-05-29 MED ORDER — SODIUM CHLORIDE 0.9 % IV BOLUS
500.0000 mL | Freq: Once | INTRAVENOUS | Status: AC
  Administered 2018-05-29: 500 mL via INTRAVENOUS

## 2018-05-29 NOTE — ED Notes (Signed)
Patient transported to CT 

## 2018-05-29 NOTE — ED Notes (Signed)
Patient back from CT.

## 2018-05-29 NOTE — ED Triage Notes (Signed)
Pt arrives POV for eval of emesis w/ gen abd pain since Friday. States unable to tolerate anything PO. Endorses mild diarrhea.

## 2018-05-29 NOTE — ED Provider Notes (Signed)
Emergency Department Provider Note   I have reviewed the triage vital signs and the nursing notes.   HISTORY  Chief Complaint Emesis   HPI Casey Chen is a 63 y.o. female with PMH of hiatal hernia, GERD, HLD, HTN, and prior CVA presents to the emergency department with nausea and vomiting over the past 3 days.  Patient reports vomiting with almost any oral intake.  She denies sensation of food getting stuck in her throat.  No chest pain but does have some epigastric discomfort which occasionally radiates to the chest.  She states this feels similar to her hiatal hernia discomfort.  She denies any fevers or chills.  No shortness of breath or palpitations.  She reports that she typically has constipation and has had some relative increase in her bowel movement frequency.  Denies any blood in the bowel movements and states they have been formed.  No close contacts with similar symptoms.  No respiratory symptoms.  No blood in the vomit.  She did try nausea medicine at home which she believes was Phenergan but states she vomited the pill up.  Does describe some current, mild, diffuse abdominal discomfort but denies anything radiating or focal.   Past Medical History:  Diagnosis Date  . Arthritis    right hip  . Depression   . Esophageal stricture 2011  . GERD (gastroesophageal reflux disease)   . Glucose intolerance (impaired glucose tolerance) 04/2015  . Head ache   . Hyperlipidemia   . Hypertension   . Left-sided weakness    due to TIA  . Pinched nerve in shoulder    left  . Sleep apnea   . Stroke ([place]) 2013   TIA on OCP, NO ESTROGEN.  recurrent TIA 08/2012  . Weakness    left arm past TIA    Patient Active Problem List   Diagnosis Date Noted  . Status post laparoscopic hysterectomy 09/29/2015  . 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 Depression 03/19/2013  . Other and unspecified hyperlipidemia 03/19/2013  . Postmenopausal 03/19/2013  . TIA  (transient ischemic attack) 03/19/2013  . HTN (hypertension) 11/02/2012  . Numbness 09/20/2012  . Benign neoplasm of colon 06/08/2010  . Esophageal reflux 04/19/2010  . Dysphagia, unspecified(787.20) 04/19/2010    Past Surgical History:  Procedure Laterality Date  . ANTERIOR AND POSTERIOR REPAIR N/A 09/29/2015   Procedure: ANTERIOR (CYSTOCELE) AND POSTERIOR REPAIR (RECTOCELE);  Surgeon: Nunzio Cobbs, MD;  Location: [facility];  Service: Gynecology;  Laterality: N/A;  . BLADDER SUSPENSION N/A 09/29/2015   Procedure: TRANSVAGINAL TAPE (TVT) PROCEDURE exact mid-urethral sling;  Surgeon: Nunzio Cobbs, MD;  Location: [facility];  Service: Gynecology;  Laterality: N/A;  . CYSTOSCOPY N/A 09/29/2015   Procedure: CYSTOSCOPY;  Surgeon: Nunzio Cobbs, MD;  Location: [facility];  Service: Gynecology;  Laterality: N/A;  . ESOPHAGOGASTRODUODENOSCOPY    . LAPAROSCOPIC HYSTERECTOMY N/A 09/29/2015   Procedure: HYSTERECTOMY TOTAL LAPAROSCOPIC;  Surgeon: Nunzio Cobbs, MD;  Location: [facility];  Service: Gynecology;  Laterality: N/A;  . SALPINGOOPHORECTOMY Bilateral 09/29/2015   Procedure: SALPINGO OOPHORECTOMY;  Surgeon: Nunzio Cobbs, MD;  Location: [facility];  Service: Gynecology;  Laterality: Bilateral;  . TUBAL LIGATION  1979  . [place]    Allergies Patient has no known allergies.  Family History  Problem Relation Age of Onset  . Colon polyps Mother  father  . Irritable bowel syndrome Mother   . Lung cancer Mother   . Multiple births Mother        one set of twins  . Depression Mother   . Brain cancer Brother   . Diabetes Sister   . Lupus Sister   . Heart disease Father   . Colon polyps Father   . Colon cancer Neg Hx     Social History Social History   Tobacco Use  . Smoking status: Former Smoker    Packs/day: 1.00    Years: 13.00    Pack years: 13.00    Types: Cigarettes    Last attempt to quit: 01/10/1990    Years  since quitting: 28.4  . Smokeless tobacco: Never Used  Substance Use Topics  . Alcohol use: Not Currently  . Drug use: No    Review of Systems Constitutional: No fever/chills Eyes: No visual changes. ENT: No sore throat. Cardiovascular: Denies chest pain. Respiratory: Denies shortness of breath. Gastrointestinal: Positive epigastric abdominal pain. Positive nausea and vomiting.  No diarrhea.  No constipation. Genitourinary: Negative for dysuria. Musculoskeletal: Negative for back pain. Skin: Negative for rash. Neurological: Negative for headaches, focal weakness or numbness.  10-point ROS otherwise negative.  ____________________________________________   PHYSICAL EXAM:  VITAL SIGNS: ED Triage Vitals  Enc Vitals Group     BP 05/29/18 1203 (!) 140/92     Pulse Rate 05/29/18 1203 98     Resp 05/29/18 1203 18     Temp 05/29/18 1203 98.8 F (37.1 C)     Temp Source 05/29/18 1203 Oral     SpO2 05/29/18 1203 98 %     Weight 05/29/18 1204 207 lb (93.9 kg)     Height 05/29/18 1204 5\' 7"  (1.702 m)     Pain Score 05/29/18 1204 4   Constitutional: Alert and oriented. Well appearing and in no acute distress. Eyes: Conjunctivae are normal. Head: Atraumatic. Nose: No congestion/rhinnorhea. Mouth/Throat: Mucous membranes are moist. Neck: No stridor. Cardiovascular: Normal rate, regular rhythm. Good peripheral circulation. Grossly normal heart sounds.   Respiratory: Normal respiratory effort.  No retractions. Lungs CTAB. Gastrointestinal: Soft with mild diffuse tenderness. No distention.  Musculoskeletal: No lower extremity tenderness nor edema. No gross deformities of extremities. Neurologic:  Normal speech and language. No gross focal neurologic deficits are appreciated.  Skin:  Skin is warm, dry and intact. No rash noted.  ____________________________________________   LABS (all labs ordered are listed, but only abnormal results are displayed)  Labs Reviewed   COMPREHENSIVE METABOLIC PANEL - Abnormal; Notable for the following components:      Result Value   Glucose, Bld 122 (*)    Creatinine, Ser 1.33 (*)    GFR calc non Af Amer 42 (*)    GFR calc Af Amer 49 (*)    All other components within normal limits  CBC - Abnormal; Notable for the following components:   RBC 5.20 (*)    Hemoglobin 15.4 (*)    HCT 47.9 (*)    All other components within normal limits  URINALYSIS, ROUTINE W REFLEX MICROSCOPIC - Abnormal; Notable for the following components:   APPearance HAZY (*)    Hgb urine dipstick SMALL (*)    Leukocytes,Ua TRACE (*)    All other components within normal limits  LIPASE, BLOOD   ____________________________________________  RADIOLOGY  CT imaging reviewed.  ____________________________________________   PROCEDURES  Procedure(s) performed:   Procedures  None ____________________________________________   INITIAL IMPRESSION / ASSESSMENT  AND PLAN / ED COURSE  Pertinent labs & imaging results that were available during my care of the patient were reviewed by me and considered in my medical decision making (see chart for details).   Patient presents to the emergency department for evaluation of nausea and vomiting.  Mild tenderness on exam.  Nothing focal.  Plan for Zofran, IV fluids, screening labs.  Given her diffuse abdominal discomfort and persistent vomiting plan for CT for further evaluation.   CT reviewed. No acute findings. Labs with mild elevation in creatinine. Patient feeling better in the ED. Plan for ODT Zofran, PO hydration at home, and PCP follow up in the coming week. Return precautions discussed.  ____________________________________________  FINAL CLINICAL IMPRESSION(S) / ED DIAGNOSES  Final diagnoses:  Generalized abdominal pain  Non-intractable vomiting with nausea, unspecified vomiting type     MEDICATIONS GIVEN DURING THIS VISIT:  Medications  ondansetron (ZOFRAN) injection 4 mg (4 mg  Intravenous Given 05/29/18 1252)  sodium chloride 0.9 % bolus 500 mL (0 mLs Intravenous Stopped 05/29/18 1604)  iohexol (OMNIPAQUE) 300 MG/ML solution 100 mL (100 mLs Intravenous Contrast Given 05/29/18 1527)     NEW OUTPATIENT MEDICATIONS STARTED DURING THIS VISIT:  Discharge Medication List as of 05/29/2018  4:07 PM    START taking these medications   Details  ondansetron (ZOFRAN ODT) 4 MG disintegrating tablet Take 1 tablet (4 mg total) by mouth every 8 (eight) hours as needed for nausea or vomiting., Starting Tue 05/29/2018, Normal        Note:  This document was prepared using Dragon voice recognition software and may include unintentional dictation errors.  Nanda Quinton, MD Emergency Medicine    Long, Wonda Olds, MD 05/30/18 [phone]

## 2018-05-29 NOTE — ED Notes (Signed)
Patient verbalizes understanding of discharge instructions. Opportunity for questioning and answers were provided. Armband removed by staff, pt discharged from ED. Pt offered wheel chair, pt denied and preferred to walk to lobby. Prescription, pharmacy and follow up care reviewed.

## 2018-05-29 NOTE — ED Notes (Signed)
ED Provider at bedside. 

## 2018-05-29 NOTE — Discharge Instructions (Signed)

## 2018-06-05 ENCOUNTER — Other Ambulatory Visit: Payer: Self-pay

## 2018-06-05 NOTE — Progress Notes (Signed)
63 y.o. [id number] Married White or Caucasian Not Hispanic or Latino female here for annual exam.   H/O LAVH/BSO/A&P repair and TVT. She has a h/o bad urge incontinence is on ditropan, now well controlled, not leaking at all.  She has renal dysfunction, last creatinine was 1.33, GFR 42. 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 No recurrent prolapse, no bowel c/o.     Patient's last menstrual period was 01/11/2007.          Sexually active: No.  The current method of family planning is status post hysterectomy.    Exercising: Yes.    gym Smoker:  no  Health Maintenance: Pap:  05-05-15 WNL NEG HR HPV  History of abnormal Pap:  no MMG:  08/07/2017 Bir[place] 1 negative Colonoscopy:  06-25-13 repeat in 41yrs  BMD:   07-29-15 WNL  TDaP:  2016 Gardasil: N/A   reports that she quit smoking about 28 years ago. Her smoking use included cigarettes. She has a 13.00 pack-year smoking history. She has never used smokeless tobacco. She reports previous alcohol use. She reports that she does not use drugs. Works as a Clinical cytogeneticist. She has 2 kids, 4 grandchildren. Her daughter and 2 of her grandchildren live with her.   Past Medical History:  Diagnosis Date  . Arthritis    right hip  . Depression   . Esophageal stricture 2011  . GERD (gastroesophageal reflux disease)   . Glucose intolerance (impaired glucose tolerance) 04/2015  . Head ache   . Hyperlipidemia   . Hypertension   . Left-sided weakness    due to TIA  . Pinched nerve in shoulder    left  . Sleep apnea   . Stroke ([place]) 2013   TIA on OCP, NO ESTROGEN.  recurrent TIA 08/2012  . Weakness    left arm past TIA    Past Surgical History:  Procedure Laterality Date  . ANTERIOR AND POSTERIOR REPAIR N/A 09/29/2015   Procedure: ANTERIOR (CYSTOCELE) AND POSTERIOR REPAIR (RECTOCELE);  Surgeon: Nunzio Cobbs, MD;  Location: [facility];  Service: Gynecology;  Laterality: N/A;  . BLADDER  SUSPENSION N/A 09/29/2015   Procedure: TRANSVAGINAL TAPE (TVT) PROCEDURE exact mid-urethral sling;  Surgeon: Nunzio Cobbs, MD;  Location: [facility];  Service: Gynecology;  Laterality: N/A;  . CYSTOSCOPY N/A 09/29/2015   Procedure: CYSTOSCOPY;  Surgeon: Nunzio Cobbs, MD;  Location: [facility];  Service: Gynecology;  Laterality: N/A;  . ESOPHAGOGASTRODUODENOSCOPY    . LAPAROSCOPIC HYSTERECTOMY N/A 09/29/2015   Procedure: HYSTERECTOMY TOTAL LAPAROSCOPIC;  Surgeon: Nunzio Cobbs, MD;  Location: [facility];  Service: Gynecology;  Laterality: N/A;  . SALPINGOOPHORECTOMY Bilateral 09/29/2015   Procedure: SALPINGO OOPHORECTOMY;  Surgeon: Nunzio Cobbs, MD;  Location: [facility];  Service: Gynecology;  Laterality: Bilateral;  . TUBAL LIGATION  1979  . UMBILICAL HERNIA REPAIR  1979    Current Outpatient Medications  Medication Sig Dispense Refill  . aspirin 325 MG tablet Take 325 mg by mouth daily.    . cetirizine (ZYRTEC) 10 MG tablet Take 10 mg by mouth daily.    . Cholecalciferol (VITAMIN D) 2000 units tablet Take 2,000 Units by mouth daily.    Marland Kitchen FLUoxetine (PROZAC) 20 MG capsule Take 40 mg by mouth daily.     Marland Kitchen losartan (COZAAR) 50 MG tablet Take 50 mg by mouth daily.  3  . Melatonin  2.5 MG CAPS Take 2.5 mg by mouth at bedtime as needed (sleep).    . metoCLOPramide (REGLAN) 10 MG tablet Take 1 tablet by mouth 4 (four) times daily.    Marland Kitchen omeprazole (PRILOSEC) 40 MG capsule Take 1 capsule by mouth daily.    . ondansetron (ZOFRAN ODT) 4 MG disintegrating tablet Take 1 tablet (4 mg total) by mouth every 8 (eight) hours as needed for nausea or vomiting. 20 tablet 0  . ONETOUCH DELICA LANCETS 65Y MISC     . ONETOUCH VERIO test strip     . oxybutynin (DITROPAN-XL) 10 MG 24 hr tablet TAKE 2 TABLETS AT BEDTIME 180 tablet 2  . rosuvastatin (CRESTOR) 40 MG tablet Take 1 tablet by mouth daily.    . verapamil (CALAN-SR) 240 MG CR tablet Take 240 mg by mouth at bedtime.     No  current facility-administered medications for this visit.     Family History  Problem Relation Age of Onset  . Colon polyps Mother        father  . Irritable bowel syndrome Mother   . Lung cancer Mother   . Multiple births Mother        one set of twins  . Depression Mother   . Brain cancer Brother   . Diabetes Sister   . Lupus Sister   . Heart disease Father   . Colon polyps Father   . Colon cancer Neg Hx     Review of Systems  Constitutional: Negative.   HENT: Negative.   Eyes: Negative.   Respiratory: Negative.   Cardiovascular: Negative.   Gastrointestinal: Negative.   Endocrine: Negative.   Genitourinary: Negative.   Musculoskeletal: Negative.   Skin: Negative.   Allergic/Immunologic: Negative.   Neurological: Negative.   Hematological: Negative.   Psychiatric/Behavioral: Negative.     Exam:   BP 122/72 (BP Location: Right Arm, Patient Position: Sitting, Cuff Size: Large)   Pulse 76   Temp 98.3 F (36.8 C) (Skin)   Ht 5' 6.54" (1.69 m)   Wt 208 lb 6.4 oz (94.5 kg)   LMP 01/11/2007 Comment: on Depo X 4 yrs prior  BMI 33.10 kg/m   Weight change: @WEIGHTCHANGE @ Height:   Height: 5' 6.54" (169 cm)  Ht Readings from Last 3 Encounters:  06/07/18 5' 6.54" (1.69 m)  05/29/18 5\' 7"  (1.702 m)  05/10/17 5' 6.5" (1.689 m)    General appearance: alert, cooperative and appears stated age Head: Normocephalic, without obvious abnormality, atraumatic Neck: no adenopathy, supple, symmetrical, trachea midline and thyroid normal to inspection and palpation Lungs: clear to auscultation bilaterally Cardiovascular: regular rate and rhythm Breasts: normal appearance, no masses or tenderness Abdomen: soft, non-tender; non distended,  no masses,  no organomegaly Extremities: extremities normal, atraumatic, no cyanosis or edema Skin: Skin color, texture, turgor normal. No rashes or lesions Lymph nodes: Cervical, supraclavicular, and axillary nodes normal. No abnormal inguinal  nodes palpated Neurologic: Grossly normal   Pelvic: External genitalia:  no lesions              Urethra:  normal appearing urethra with no masses, tenderness or lesions              Bartholins and Skenes: normal                 Vagina: mildly atrophic appearing vagina with normal color and discharge, no lesions. No prolapse              Cervix: absent  Bimanual Exam:  Uterus:  uterus absent              Adnexa: no mass, fullness, tenderness               Rectovaginal: Confirms               Anus:  normal sphincter tone, no lesions  Chaperone was present for exam.  A:  Well Woman with normal exam  Urge incontinence, controlled with ditropan  P:   No pap needed  Mammogram 7/20  DEXA and colonoscopy UTD  Discussed breast self exam  Discussed calcium and vit D intake  Labs with primary

## 2018-06-07 ENCOUNTER — Other Ambulatory Visit: Payer: Self-pay

## 2018-06-07 ENCOUNTER — Encounter: Payer: Self-pay | Admitting: Obstetrics and Gynecology

## 2018-06-07 ENCOUNTER — Ambulatory Visit: Payer: 59 | Admitting: Obstetrics and Gynecology

## 2018-06-07 VITALS — BP 122/72 | HR 76 | Temp 98.3°F | Ht 66.54 in | Wt 208.4 lb

## 2018-06-07 DIAGNOSIS — Z01419 Encounter for gynecological examination (general) (routine) without abnormal findings: Secondary | ICD-10-CM | POA: Diagnosis not present

## 2018-06-07 DIAGNOSIS — N3941 Urge incontinence: Secondary | ICD-10-CM | POA: Diagnosis not present

## 2018-06-07 MED ORDER — OXYBUTYNIN CHLORIDE ER 10 MG PO TB24: ORAL_TABLET | ORAL | 3 refills | Status: DC

## 2018-06-07 NOTE — Patient Instructions (Signed)
EXERCISE AND DIET:  We recommended that you start or continue a regular exercise program for good health. Regular exercise means any activity that makes your heart beat faster and makes you sweat.  We recommend exercising at least 30 minutes per day at least 3 days a week, preferably 4 or 5.  We also recommend a diet low in fat and sugar.  Inactivity, poor dietary choices and obesity can cause diabetes, heart attack, stroke, and kidney damage, among others.    ALCOHOL AND SMOKING:  Women should limit their alcohol intake to no more than 7 drinks/beers/glasses of wine (combined, not each!) per week. Moderation of alcohol intake to this level decreases your risk of breast cancer and liver damage. And of course, no recreational drugs are part of a healthy lifestyle.  And absolutely no smoking or even second hand smoke. Most people know smoking can cause heart and lung diseases, but did you know it also contributes to weakening of your bones? Aging of your skin?  Yellowing of your teeth and nails?  CALCIUM AND VITAMIN D:  Adequate intake of calcium and Vitamin D are recommended.  The recommendations for exact amounts of these supplements seem to change often, but generally speaking 1,200 mg of calcium (between diet and supplement) and 800 units of Vitamin D per day seems prudent. Certain women may benefit from higher intake of Vitamin D.  If you are among these women, your doctor will have told you during your visit.    PAP SMEARS:  Pap smears, to check for cervical cancer or precancers,  have traditionally been done yearly, although recent scientific advances have shown that most women can have pap smears less often.  However, every woman still should have a physical exam from her gynecologist every year. It will include a breast check, inspection of the vulva and vagina to check for abnormal growths or skin changes, a visual exam of the cervix, and then an exam to evaluate the size and shape of the uterus and  ovaries.  And after 63 years of age, a rectal exam is indicated to check for rectal cancers. We will also provide age appropriate advice regarding health maintenance, like when you should have certain vaccines, screening for sexually transmitted diseases, bone density testing, colonoscopy, mammograms, etc.   MAMMOGRAMS:  All women over 40 years old should have a yearly mammogram. Many facilities now offer a "3D" mammogram, which may cost around $50 extra out of pocket. If possible,  we recommend you accept the option to have the 3D mammogram performed.  It both reduces the number of women who will be called back for extra views which then turn out to be normal, and it is better than the routine mammogram at detecting truly abnormal areas.    COLON CANCER SCREENING: Now recommend starting at age 45. At this time colonoscopy is not covered for routine screening until 50. There are take home tests that can be done between 45-49.   COLONOSCOPY:  Colonoscopy to screen for colon cancer is recommended for all women at age 50.  We know, you hate the idea of the prep.  We agree, BUT, having colon cancer and not knowing it is worse!!  Colon cancer so often starts as a polyp that can be seen and removed at colonscopy, which can quite literally save your life!  And if your first colonoscopy is normal and you have no family history of colon cancer, most women don't have to have it again for   10 years.  Once every ten years, you can do something that may end up saving your life, right?  We will be happy to help you get it scheduled when you are ready.  Be sure to check your insurance coverage so you understand how much it will cost.  It may be covered as a preventative service at no cost, but you should check your particular policy.      Breast Self-Awareness Breast self-awareness means being familiar with how your breasts look and feel. It involves checking your breasts regularly and reporting any changes to your  health care provider. Practicing breast self-awareness is important. A change in your breasts can be a sign of a serious medical problem. Being familiar with how your breasts look and feel allows you to find any problems early, when treatment is more likely to be successful. All women should practice breast self-awareness, including women who have had breast implants. How to do a breast self-exam One way to learn what is normal for your breasts and whether your breasts are changing is to do a breast self-exam. To do a breast self-exam: Look for Changes  1. Remove all the clothing above your waist. 2. Stand in front of a mirror in a room with good lighting. 3. Put your hands on your hips. 4. Push your hands firmly downward. 5. Compare your breasts in the mirror. Look for differences between them (asymmetry), such as: ? Differences in shape. ? Differences in size. ? Puckers, dips, and bumps in one breast and not the other. 6. Look at each breast for changes in your skin, such as: ? Redness. ? Scaly areas. 7. Look for changes in your nipples, such as: ? Discharge. ? Bleeding. ? Dimpling. ? Redness. ? A change in position. Feel for Changes Carefully feel your breasts for lumps and changes. It is best to do this while lying on your back on the floor and again while sitting or standing in the shower or tub with soapy water on your skin. Feel each breast in the following way:  Place the arm on the side of the breast you are examining above your head.  Feel your breast with the other hand.  Start in the nipple area and make  inch (2 cm) overlapping circles to feel your breast. Use the pads of your three middle fingers to do this. Apply light pressure, then medium pressure, then firm pressure. The light pressure will allow you to feel the tissue closest to the skin. The medium pressure will allow you to feel the tissue that is a little deeper. The firm pressure will allow you to feel the tissue  close to the ribs.  Continue the overlapping circles, moving downward over the breast until you feel your ribs below your breast.  Move one finger-width toward the center of the body. Continue to use the  inch (2 cm) overlapping circles to feel your breast as you move slowly up toward your collarbone.  Continue the up and down exam using all three pressures until you reach your armpit.  Write Down What You Find  Write down what is normal for each breast and any changes that you find. Keep a written record with breast changes or normal findings for each breast. By writing this information down, you do not need to depend only on memory for size, tenderness, or location. Write down where you are in your menstrual cycle, if you are still menstruating. If you are having trouble noticing differences   in your breasts, do not get discouraged. With time you will become more familiar with the variations in your breasts and more comfortable with the exam. How often should I examine my breasts? Examine your breasts every month. If you are breastfeeding, the best time to examine your breasts is after a feeding or after using a breast pump. If you menstruate, the best time to examine your breasts is 5-7 days after your period is over. During your period, your breasts are lumpier, and it may be more difficult to notice changes. When should I see my health care provider? See your health care provider if you notice:  A change in shape or size of your breasts or nipples.  A change in the skin of your breast or nipples, such as a reddened or scaly area.  Unusual discharge from your nipples.  A lump or thick area that was not there before.  Pain in your breasts.  Anything that concerns you.  

## 2019-01-31 ENCOUNTER — Other Ambulatory Visit: Payer: Self-pay | Admitting: Family Medicine

## 2019-01-31 ENCOUNTER — Other Ambulatory Visit: Payer: Self-pay

## 2019-01-31 ENCOUNTER — Ambulatory Visit: Payer: Self-pay

## 2019-01-31 DIAGNOSIS — M25521 Pain in right elbow: Secondary | ICD-10-CM

## 2019-04-02 ENCOUNTER — Encounter: Payer: Self-pay | Admitting: Certified Nurse Midwife

## 2019-06-06 NOTE — Progress Notes (Signed)
64 y.o. [id number] Married White or Caucasian Not Hispanic or Latino female here for annual exam.  H/O LAVH/BSO/A&P repair and TVT. 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   H/O urge incontinence on ditropan. Stable, she goes frequently to prevent leakage, she leaks 1-2 x a day, small to large amounts. Nocturia x 1. H/O kidney disease is stable, Nephrologist is aware she is on ditropan.  She has constipation, typically goes every 2-3 days, but can easily go a week between a BM (then not a large amount). No abdominal pain, but lots of gas. It has been like this for years.     Patient's last menstrual period was 01/11/2007.          Sexually active: No.  The current method of family planning is post menopausal status.    Exercising: No.  The patient does not participate in regular exercise at present. Smoker:  no  Health Maintenance: Pap:  05/05/15 WNL Neg Hr Hpv  History of abnormal Pap:  no MMG: 08/09/18 Mammogram negative BMD:   07-29-15 WNL Colonoscopy: 2020, had polyps, needs another one this year TDaP:  2016 Gardasil: NA   reports that she quit smoking about 29 years ago. Her smoking use included cigarettes. She has a 13.00 pack-year smoking history. She has never used smokeless tobacco. She reports previous alcohol use. She reports that she does not use drugs. Just occasional ETOH. She has 2 kids, 4 grandchildren. Her daughter and 2 of her grandchildren live with her (54 and 46). Other grand kids are 82 and 21. 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  Past Medical History:  Diagnosis Date  . Arthritis    right hip  . Depression   . Esophageal stricture 2011  . GERD (gastroesophageal reflux disease)   . Glucose intolerance (impaired glucose tolerance) 04/2015  . Head ache   . Hyperlipidemia   . Hypertension   . Left-sided weakness    due to TIA  . Pinched nerve in shoulder    left  . Sleep apnea   . Stroke ([place]) 2013   TIA on OCP, NO ESTROGEN.   recurrent TIA 08/2012  . Weakness    left arm past TIA    Past Surgical History:  Procedure Laterality Date  . ANTERIOR AND POSTERIOR REPAIR N/A 09/29/2015   Procedure: ANTERIOR (CYSTOCELE) AND POSTERIOR REPAIR (RECTOCELE);  Surgeon: Nunzio Cobbs, MD;  Location: [facility];  Service: Gynecology;  Laterality: N/A;  . BLADDER SUSPENSION N/A 09/29/2015   Procedure: TRANSVAGINAL TAPE (TVT) PROCEDURE exact mid-urethral sling;  Surgeon: Nunzio Cobbs, MD;  Location: [facility];  Service: Gynecology;  Laterality: N/A;  . CYSTOSCOPY N/A 09/29/2015   Procedure: CYSTOSCOPY;  Surgeon: Nunzio Cobbs, MD;  Location: [facility];  Service: Gynecology;  Laterality: N/A;  . ESOPHAGOGASTRODUODENOSCOPY    . LAPAROSCOPIC HYSTERECTOMY N/A 09/29/2015   Procedure: HYSTERECTOMY TOTAL LAPAROSCOPIC;  Surgeon: Nunzio Cobbs, MD;  Location: [facility];  Service: Gynecology;  Laterality: N/A;  . SALPINGOOPHORECTOMY Bilateral 09/29/2015   Procedure: SALPINGO OOPHORECTOMY;  Surgeon: Nunzio Cobbs, MD;  Location: [facility];  Service: Gynecology;  Laterality: Bilateral;  . TUBAL LIGATION  1979  . UMBILICAL HERNIA REPAIR  1979    Current Outpatient Medications  Medication Sig Dispense Refill  . aspirin 325 MG tablet Take 325 mg by mouth daily.    . cetirizine (ZYRTEC) 10 MG tablet  Take 10 mg by mouth daily.    . Cholecalciferol (VITAMIN D) 2000 units tablet Take 2,000 Units by mouth daily.    Marland Kitchen FLUoxetine (PROZAC) 20 MG capsule Take 40 mg by mouth daily.     Marland Kitchen losartan (COZAAR) 50 MG tablet Take 50 mg by mouth daily.  3  . Melatonin 2.5 MG CAPS Take 2.5 mg by mouth at bedtime as needed (sleep).    . metoCLOPramide (REGLAN) 10 MG tablet Take 1 tablet by mouth 4 (four) times daily.    Marland Kitchen omeprazole (PRILOSEC) 40 MG capsule Take 1 capsule by mouth daily.    . ondansetron (ZOFRAN ODT) 4 MG disintegrating tablet Take 1 tablet (4 mg total) by mouth every 8 (eight) hours as needed for nausea or  vomiting. 20 tablet 0  . ONETOUCH DELICA LANCETS [id number] MISC     . ONETOUCH VERIO test strip     . oxybutynin (DITROPAN-XL) 10 MG 24 hr tablet TAKE 2 TABLETS AT BEDTIME 180 tablet 3  . rosuvastatin (CRESTOR) 40 MG tablet Take 1 tablet by mouth daily.    . verapamil (CALAN-SR) 240 MG CR tablet Take 240 mg by mouth at bedtime.     No current facility-administered medications for this visit.    Family History  Problem Relation Age of Onset  . Colon polyps Mother        father  . Irritable bowel syndrome Mother   . Lung cancer Mother   . Multiple births Mother        one set of twins  . Depression Mother   . Brain cancer Brother   . Diabetes Sister   . Lupus Sister   . Heart disease Father   . Colon polyps Father   . Colon cancer Neg Hx     Review of Systems  Constitutional: Negative.   HENT: Negative.   Eyes: Negative.   Respiratory: Negative.   Cardiovascular: Negative.   Gastrointestinal: Negative.   Endocrine: Negative.   Genitourinary: Negative.   Musculoskeletal: Negative.   Skin: Negative.   Allergic/Immunologic: Positive for environmental allergies.  Neurological: Negative.   Hematological: Negative.   Psychiatric/Behavioral: Negative.     Exam:   LMP 01/11/2007 Comment: on Depo X 4 yrs prior  Weight change: @WEIGHTCHANGE @ Height:      Ht Readings from Last 3 Encounters:  06/07/18 5' 6.54" (1.69 m)  05/29/18 5\' 7"  (1.702 m)  05/10/17 5' 6.5" (1.689 m)    General appearance: alert, cooperative and appears stated age Head: Normocephalic, without obvious abnormality, atraumatic Neck: no adenopathy, supple, symmetrical, trachea midline and thyroid normal to inspection and palpation Lungs: clear to auscultation bilaterally Cardiovascular: regular rate and rhythm Breasts: normal appearance, no masses or tenderness Abdomen: soft, non-tender; non distended,  no masses,  no organomegaly Extremities: extremities normal, atraumatic, no cyanosis or edema Skin: Skin  color, texture, turgor normal. No rashes or lesions Lymph nodes: Cervical, supraclavicular, and axillary nodes normal. No abnormal inguinal nodes palpated Neurologic: Grossly normal   Pelvic: External genitalia:  no lesions              Urethra:  normal appearing urethra with no masses, tenderness or lesions              Bartholins and Skenes: normal                 Vagina: normal appearing vagina with normal color and discharge, no lesions  Cervix: absent               Bimanual Exam:  Uterus:  not examined              Adnexa: no mass, fullness, tenderness               Rectovaginal: Confirms               Anus:  normal sphincter tone, no lesions  Gae Dry chaperoned for the exam.  A:  Well Woman with normal exam  Urge incontinence is tolerable with the medication  Constipation. She is aware the ditropan can worsen constipation, wants to stay on it.   P:   No pap is needed  Mammogram, suspect due, she will call  Colonoscopy again this year  Try miralax, she should discuss constipation with her primary and GI  DEXA UTD  Discussed breast self exam  Discussed calcium and vit D intake

## 2019-06-11 ENCOUNTER — Other Ambulatory Visit: Payer: Self-pay

## 2019-06-12 ENCOUNTER — Ambulatory Visit: Payer: 59 | Admitting: Obstetrics and Gynecology

## 2019-06-12 ENCOUNTER — Encounter: Payer: Self-pay | Admitting: Obstetrics and Gynecology

## 2019-06-12 VITALS — BP 110/62 | HR 106 | Temp 98.4°F | Ht 66.25 in | Wt 237.0 lb

## 2019-06-12 DIAGNOSIS — K5900 Constipation, unspecified: Secondary | ICD-10-CM

## 2019-06-12 DIAGNOSIS — N3941 Urge incontinence: Secondary | ICD-10-CM | POA: Diagnosis not present

## 2019-06-12 DIAGNOSIS — Z01419 Encounter for gynecological examination (general) (routine) without abnormal findings: Secondary | ICD-10-CM

## 2019-06-12 MED ORDER — OXYBUTYNIN CHLORIDE ER 10 MG PO TB24: ORAL_TABLET | ORAL | 3 refills | Status: DC

## 2019-06-12 NOTE — Patient Instructions (Signed)
EXERCISE AND DIET:  We recommended that you start or continue a regular exercise program for good health. Regular exercise means any activity that makes your heart beat faster and makes you sweat.  We recommend exercising at least 30 minutes per day at least 3 days a week, preferably 4 or 5.  We also recommend a diet low in fat and sugar.  Inactivity, poor dietary choices and obesity can cause diabetes, heart attack, stroke, and kidney damage, among others.    ALCOHOL AND SMOKING:  Women should limit their alcohol intake to no more than 7 drinks/beers/glasses of wine (combined, not each!) per week. Moderation of alcohol intake to this level decreases your risk of breast cancer and liver damage. And of course, no recreational drugs are part of a healthy lifestyle.  And absolutely no smoking or even second hand smoke. Most people know smoking can cause heart and lung diseases, but did you know it also contributes to weakening of your bones? Aging of your skin?  Yellowing of your teeth and nails?  CALCIUM AND VITAMIN D:  Adequate intake of calcium and Vitamin D are recommended.  The recommendations for exact amounts of these supplements seem to change often, but generally speaking 1,200 mg of calcium (between diet and supplement) and 800 units of Vitamin D per day seems prudent. Certain women may benefit from higher intake of Vitamin D.  If you are among these women, your doctor will have told you during your visit.    PAP SMEARS:  Pap smears, to check for cervical cancer or precancers,  have traditionally been done yearly, although recent scientific advances have shown that most women can have pap smears less often.  However, every woman still should have a physical exam from her gynecologist every year. It will include a breast check, inspection of the vulva and vagina to check for abnormal growths or skin changes, a visual exam of the cervix, and then an exam to evaluate the size and shape of the uterus and  ovaries.  And after 64 years of age, a rectal exam is indicated to check for rectal cancers. We will also provide age appropriate advice regarding health maintenance, like when you should have certain vaccines, screening for sexually transmitted diseases, bone density testing, colonoscopy, mammograms, etc.   MAMMOGRAMS:  All women over 40 years old should have a yearly mammogram. Many facilities now offer a "3D" mammogram, which may cost around $50 extra out of pocket. If possible,  we recommend you accept the option to have the 3D mammogram performed.  It both reduces the number of women who will be called back for extra views which then turn out to be normal, and it is better than the routine mammogram at detecting truly abnormal areas.    COLON CANCER SCREENING: Now recommend starting at age 45. At this time colonoscopy is not covered for routine screening until 50. There are take home tests that can be done between 45-49.   COLONOSCOPY:  Colonoscopy to screen for colon cancer is recommended for all women at age 50.  We know, you hate the idea of the prep.  We agree, BUT, having colon cancer and not knowing it is worse!!  Colon cancer so often starts as a polyp that can be seen and removed at colonscopy, which can quite literally save your life!  And if your first colonoscopy is normal and you have no family history of colon cancer, most women don't have to have it again for   10 years.  Once every ten years, you can do something that may end up saving your life, right?  We will be happy to help you get it scheduled when you are ready.  Be sure to check your insurance coverage so you understand how much it will cost.  It may be covered as a preventative service at no cost, but you should check your particular policy.      Breast Self-Awareness Breast self-awareness means being familiar with how your breasts look and feel. It involves checking your breasts regularly and reporting any changes to your  health care provider. Practicing breast self-awareness is important. A change in your breasts can be a sign of a serious medical problem. Being familiar with how your breasts look and feel allows you to find any problems early, when treatment is more likely to be successful. All women should practice breast self-awareness, including women who have had breast implants. How to do a breast self-exam One way to learn what is normal for your breasts and whether your breasts are changing is to do a breast self-exam. To do a breast self-exam: Look for Changes  1. Remove all the clothing above your waist. 2. Stand in front of a mirror in a room with good lighting. 3. Put your hands on your hips. 4. Push your hands firmly downward. 5. Compare your breasts in the mirror. Look for differences between them (asymmetry), such as: ? Differences in shape. ? Differences in size. ? Puckers, dips, and bumps in one breast and not the other. 6. Look at each breast for changes in your skin, such as: ? Redness. ? Scaly areas. 7. Look for changes in your nipples, such as: ? Discharge. ? Bleeding. ? Dimpling. ? Redness. ? A change in position. Feel for Changes Carefully feel your breasts for lumps and changes. It is best to do this while lying on your back on the floor and again while sitting or standing in the shower or tub with soapy water on your skin. Feel each breast in the following way:  Place the arm on the side of the breast you are examining above your head.  Feel your breast with the other hand.  Start in the nipple area and make  inch (2 cm) overlapping circles to feel your breast. Use the pads of your three middle fingers to do this. Apply light pressure, then medium pressure, then firm pressure. The light pressure will allow you to feel the tissue closest to the skin. The medium pressure will allow you to feel the tissue that is a little deeper. The firm pressure will allow you to feel the tissue  close to the ribs.  Continue the overlapping circles, moving downward over the breast until you feel your ribs below your breast.  Move one finger-width toward the center of the body. Continue to use the  inch (2 cm) overlapping circles to feel your breast as you move slowly up toward your collarbone.  Continue the up and down exam using all three pressures until you reach your armpit.  Write Down What You Find  Write down what is normal for each breast and any changes that you find. Keep a written record with breast changes or normal findings for each breast. By writing this information down, you do not need to depend only on memory for size, tenderness, or location. Write down where you are in your menstrual cycle, if you are still menstruating. If you are having trouble noticing differences   in your breasts, do not get discouraged. With time you will become more familiar with the variations in your breasts and more comfortable with the exam. How often should I examine my breasts? Examine your breasts every month. If you are breastfeeding, the best time to examine your breasts is after a feeding or after using a breast pump. If you menstruate, the best time to examine your breasts is 5-7 days after your period is over. During your period, your breasts are lumpier, and it may be more difficult to notice changes. When should I see my health care provider? See your health care provider if you notice:  A change in shape or size of your breasts or nipples.  A change in the skin of your breast or nipples, such as a reddened or scaly area.  Unusual discharge from your nipples.  A lump or thick area that was not there before.  Pain in your breasts.  Anything that concerns you.  About Constipation  TRY MIRALAX  Constipation Overview Constipation is the most common gastrointestinal complaint -- about 4 million Americans experience constipation and make 2.5 million physician visits a year to  get help for the problem.  Constipation can occur when the colon absorbs too much water, the colon's muscle contraction is slow or sluggish, and/or there is delayed transit time through the colon.  The result is stool that is hard and dry.  Indicators of constipation include straining during bowel movements greater than 25% of the time, having fewer than three bowel movements per week, and/or the feeling of incomplete evacuation.  There are established guidelines ([place] II ) for defining constipation. A person needs to have two or more of the following symptoms for at least 12 weeks (not necessarily consecutive) in the preceding 12 months: . Straining in  greater than 25% of bowel movements . Lumpy or hard stools in greater than 25% of bowel movements . Sensation of incomplete emptying in greater than 25% of bowel movements . Sensation of anorectal obstruction/blockade in greater than 25% of bowel movements . Manual maneuvers to help empty greater than 25% of bowel movements (e.g., digital evacuation, support of the pelvic floor)  . Less than  3 bowel movements/week . Loose stools are not present, and criteria for irritable bowel syndrome are insufficient  Common Causes of Constipation . Lack of fiber in your diet . Lack of physical activity . Medications, including iron and calcium supplements  . Dairy intake . Dehydration . Abuse of laxatives  Travel  Irritable Bowel Syndrome  Pregnancy  Luteal phase of menstruation (after ovulation and before menses)  Colorectal problems  Intestinal Dysfunction  Treating Constipation  There are several ways of treating constipation, including changes to diet and exercise, use of laxatives, adjustments to the pelvic floor, and scheduled toileting.  These treatments include: . increasing fiber and fluids in the diet  . increasing physical activity . learning muscle coordination   learning proper toileting techniques and toileting modifications     designing and sticking  to a toileting schedule     2007, [employer] Doc.22

## 2019-08-08 ENCOUNTER — Encounter: Payer: Self-pay | Admitting: Obstetrics and Gynecology

## 2019-11-15 ENCOUNTER — Encounter: Payer: Self-pay | Admitting: Obstetrics and Gynecology

## 2020-04-27 ENCOUNTER — Telehealth: Payer: Self-pay

## 2020-04-27 NOTE — Telephone Encounter (Signed)
Spoke with patient. She has had recent renal panels at [facility] and another MD.  She said as soon as she can get these results she will fax them to me.

## 2020-04-27 NOTE — Telephone Encounter (Signed)
I would want to see lab work with her renal panel prior to changing her medication.  She had some altered kidney function in 5/20. Dosing of the vesicare would depend on her renal function.

## 2020-04-27 NOTE — Telephone Encounter (Signed)
Pharmacy called. Patient would like to see if you will okay her changing her Oxybutin ER prescription to Vesicare as it will be more cost effective for her with her insurance.  Last AEX 06/12/19. Scheduled 06/17/20.

## 2020-06-10 NOTE — Progress Notes (Signed)
65 y.o. [id number] Married White or Caucasian Not Hispanic or Latino female here for annual exam.  H/O LAVH/BSO/A&P repair and TVT.    H/O urge incontinence. She is on ditropan, interested in changing to vesicare. Leaks several times a day, small to large amounts. She is up several times at night to void.  She has mild GSI.   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  Constipation is stable.   She has kidney disease, last GFR was 28. She has a Nephrologist who is aware of her medication for urge incontinence.    Patient's last menstrual period was 01/11/2007.          Sexually active: No.  The current method of family planning is status post hysterectomy.    Exercising: Yes.    Walking  Smoker:  no  Health Maintenance: Pap:   05/05/15 WNL Neg Hr Hpv  History of abnormal Pap:  no MMG:  11/15/19 Bi-rads 1 neg  BMD:   07/29/15 WNL Colonoscopy: 2020 had polyps, f/u 5 years  TDaP:  2016  Gardasil: NA   reports that she quit smoking about 30 years ago. Her smoking use included cigarettes. She has a 13.00 pack-year smoking history. She has never used smokeless tobacco. She reports previous alcohol use. She reports that she does not use drugs. She has one drink most nights. She has 2 kids, 4 grandchildren. Works as a Clinical cytogeneticist.   Past Medical History:  Diagnosis Date  . Arthritis    right hip  . Depression   . Esophageal stricture 2011  . GERD (gastroesophageal reflux disease)   . Glucose intolerance (impaired glucose tolerance) 04/2015  . Head ache   . Hyperlipidemia   . Hypertension   . Left-sided weakness    due to TIA  . Pinched nerve in shoulder    left  . Sleep apnea   . Stroke ([place]) 2013   TIA on OCP, NO ESTROGEN.  recurrent TIA 08/2012  . Weakness    left arm past TIA    Past Surgical History:  Procedure Laterality Date  . ANTERIOR AND POSTERIOR REPAIR N/A 09/29/2015   Procedure: ANTERIOR (CYSTOCELE) AND POSTERIOR REPAIR (RECTOCELE);  Surgeon: Nunzio Cobbs, MD;   Location: [facility];  Service: Gynecology;  Laterality: N/A;  . BLADDER SUSPENSION N/A 09/29/2015   Procedure: TRANSVAGINAL TAPE (TVT) PROCEDURE exact mid-urethral sling;  Surgeon: Nunzio Cobbs, MD;  Location: [facility];  Service: Gynecology;  Laterality: N/A;  . CYSTOSCOPY N/A 09/29/2015   Procedure: CYSTOSCOPY;  Surgeon: Nunzio Cobbs, MD;  Location: [facility];  Service: Gynecology;  Laterality: N/A;  . ESOPHAGOGASTRODUODENOSCOPY    . LAPAROSCOPIC HYSTERECTOMY N/A 09/29/2015   Procedure: HYSTERECTOMY TOTAL LAPAROSCOPIC;  Surgeon: Nunzio Cobbs, MD;  Location: [facility];  Service: Gynecology;  Laterality: N/A;  . SALPINGOOPHORECTOMY Bilateral 09/29/2015   Procedure: SALPINGO OOPHORECTOMY;  Surgeon: Nunzio Cobbs, MD;  Location: [facility];  Service: Gynecology;  Laterality: Bilateral;  . TUBAL LIGATION  1979  . UMBILICAL HERNIA REPAIR  1979    Current Outpatient Medications  Medication Sig Dispense Refill  . aspirin 325 MG tablet Take 325 mg by mouth daily.    . cetirizine (ZYRTEC) 10 MG tablet Take 10 mg by mouth daily.    . Cholecalciferol (VITAMIN D) 2000 units tablet Take 2,000 Units by mouth daily.    Marland Kitchen FLUoxetine (PROZAC) 20 MG capsule Take 40 mg by mouth daily.     Marland Kitchen  losartan (COZAAR) 50 MG tablet Take 50 mg by mouth daily.  3  . Melatonin 2.5 MG CAPS Take 2.5 mg by mouth at bedtime as needed (sleep).    . metoCLOPramide (REGLAN) 10 MG tablet Take 1 tablet by mouth 4 (four) times daily.    Marland Kitchen omeprazole (PRILOSEC) 40 MG capsule Take 1 capsule by mouth daily.    . ondansetron (ZOFRAN ODT) 4 MG disintegrating tablet Take 1 tablet (4 mg total) by mouth every 8 (eight) hours as needed for nausea or vomiting. 20 tablet 0  . ONETOUCH DELICA LANCETS 12X MISC     . ONETOUCH VERIO test strip     . oxybutynin (DITROPAN-XL) 10 MG 24 hr tablet TAKE 2 TABLETS AT BEDTIME 180 tablet 3  . rosuvastatin (CRESTOR) 40 MG tablet Take 1 tablet by mouth daily.    . verapamil  (CALAN-SR) 240 MG CR tablet Take 240 mg by mouth at bedtime.     No current facility-administered medications for this visit.    Family History  Problem Relation Age of Onset  . Colon polyps Mother        father  . Irritable bowel syndrome Mother   . Lung cancer Mother   . Multiple births Mother        one set of twins  . Depression Mother   . Brain cancer Brother   . Diabetes Sister   . Lupus Sister   . Heart disease Father   . Colon polyps Father   . Colon cancer Neg Hx     Review of Systems  Psychiatric/Behavioral: Positive for dysphoric mood.  All other systems reviewed and are negative.   Exam:   LMP 01/11/2007 Comment: on Depo X 4 yrs prior  Weight change: @WEIGHTCHANGE @ Height:      Ht Readings from Last 3 Encounters:  06/12/19 5' 6.25" (1.683 m)  06/07/18 5' 6.54" (1.69 m)  05/29/18 5\' 7"  (1.702 m)    General appearance: alert, cooperative and appears stated age Head: Normocephalic, without obvious abnormality, atraumatic Neck: no adenopathy, supple, symmetrical, trachea midline and thyroid normal to inspection and palpation Lungs: clear to auscultation bilaterally Cardiovascular: regular rate and rhythm Breasts: normal appearance, no masses or tenderness Abdomen: soft, non-tender; non distended,  no masses,  no organomegaly Extremities: extremities normal, atraumatic, no cyanosis or edema Skin: Skin color, texture, turgor normal. No rashes or lesions Lymph nodes: Cervical, supraclavicular, and axillary nodes normal. No abnormal inguinal nodes palpated Neurologic: Grossly normal   Pelvic: External genitalia:  no lesions              Urethra:  normal appearing urethra with no masses, tenderness or lesions              Bartholins and Skenes: normal                 Vagina: normal appearing vagina with normal color and discharge, no lesions              Cervix: absent               Bimanual Exam:  Uterus:  uterus absent              Adnexa: no mass,  fullness, tenderness               Rectovaginal: Confirms               Anus:  normal sphincter tone, no lesions  Gae Dry chaperoned for the  exam.  1. Well woman exam Discussed breast self exam Discussed calcium and vit D intake Mammogram in 11/22 Colonoscopy and DEXA are UTD No pap needed  2. Urge incontinence She wants to change from ditropan to Vesicare - solifenacin (VESICARE) 5 MG tablet; Take 1 tablet (5 mg total) by mouth daily. One po qd  Dispense: 90 tablet; Refill: 3 -5 mg is the max dose of vesicare with her low GFR -She will call if she desires a referral to Urology

## 2020-06-17 ENCOUNTER — Encounter: Payer: Self-pay | Admitting: Obstetrics and Gynecology

## 2020-06-17 ENCOUNTER — Ambulatory Visit (INDEPENDENT_AMBULATORY_CARE_PROVIDER_SITE_OTHER): Payer: Commercial Managed Care - PPO | Admitting: Obstetrics and Gynecology

## 2020-06-17 ENCOUNTER — Other Ambulatory Visit: Payer: Self-pay

## 2020-06-17 VITALS — BP 104/62 | HR 82 | Ht 66.25 in | Wt 217.6 lb

## 2020-06-17 DIAGNOSIS — N3941 Urge incontinence: Secondary | ICD-10-CM | POA: Diagnosis not present

## 2020-06-17 DIAGNOSIS — Z01419 Encounter for gynecological examination (general) (routine) without abnormal findings: Secondary | ICD-10-CM

## 2020-06-17 MED ORDER — SOLIFENACIN SUCCINATE 5 MG PO TABS: 5.0000 mg | ORAL_TABLET | Freq: Every day | ORAL | 3 refills | Status: DC

## 2020-06-17 NOTE — Patient Instructions (Signed)

## 2020-10-13 ENCOUNTER — Encounter: Payer: Self-pay | Admitting: Gastroenterology
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
## 2021-07-15 ENCOUNTER — Other Ambulatory Visit: Payer: Self-pay | Admitting: Obstetrics and Gynecology

## 2021-07-15 DIAGNOSIS — N3941 Urge incontinence: Secondary | ICD-10-CM

## 2021-07-15 NOTE — Telephone Encounter (Signed)
Sent pt mychart msg re: needing UTD annual exam. Last one was 06/17/20.

## 2021-07-19 NOTE — Telephone Encounter (Signed)
Last annual exam 06/2020 No exam scheduled.

## 2021-07-20 NOTE — Telephone Encounter (Signed)
FYI. Pt hs not read mychart msg. Per Earnest Bailey: Pt was called 07/16/21 and 07/20/21 to schedule annual and VM was left and pt has not returned call.

## 2021-10-14 ENCOUNTER — Other Ambulatory Visit: Payer: Self-pay | Admitting: Obstetrics and Gynecology

## 2021-10-14 DIAGNOSIS — N3941 Urge incontinence: Secondary | ICD-10-CM

## 2021-10-14 NOTE — Telephone Encounter (Signed)
Last AEX 06/17/2020--nothing scheduled.   Last Rxd for #90 in 07/2021. Pt was sent mychart msg that has not yet been read and also was called by admin in 07/2021 2x and msg was left for her to call back and schedule her annual. Pt has not returned call or scheduled as of yet.   Send letter? Please advise.
